# Patient Record
Sex: Male | Born: 1965 | Race: White | Hispanic: No | Marital: Single | Smoking: Current every day smoker
Health system: Southern US, Community
[De-identification: ages and names within clinical notes are randomized; demographics above are authoritative.]

## PROBLEM LIST (undated history)

## (undated) DIAGNOSIS — G2581 Restless legs syndrome: Secondary | ICD-10-CM

## (undated) DIAGNOSIS — M1990 Unspecified osteoarthritis, unspecified site: Secondary | ICD-10-CM

## (undated) DIAGNOSIS — Z5189 Encounter for other specified aftercare: Secondary | ICD-10-CM

## (undated) DIAGNOSIS — Z720 Tobacco use: Secondary | ICD-10-CM

## (undated) DIAGNOSIS — N289 Disorder of kidney and ureter, unspecified: Secondary | ICD-10-CM

## (undated) HISTORY — DX: Tobacco use: Z72.0

## (undated) HISTORY — PX: JOINT REPLACEMENT: SHX530

## (undated) HISTORY — DX: Restless legs syndrome: G25.81

## (undated) HISTORY — DX: Unspecified osteoarthritis, unspecified site: M19.90

## (undated) HISTORY — PX: CHOLECYSTECTOMY: SHX55

## (undated) HISTORY — DX: Encounter for other specified aftercare: Z51.89

---
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
## 2005-06-05 ENCOUNTER — Ambulatory Visit: Payer: Self-pay | Admitting: Internal Medicine

## 2005-06-12 ENCOUNTER — Ambulatory Visit: Payer: Self-pay | Admitting: Internal Medicine

## 2005-08-21 ENCOUNTER — Ambulatory Visit: Payer: Self-pay | Admitting: Internal Medicine

## 2005-10-19 ENCOUNTER — Ambulatory Visit: Payer: Self-pay | Admitting: Family Medicine

## 2006-02-01 ENCOUNTER — Ambulatory Visit: Payer: Self-pay | Admitting: Family Medicine

## 2006-02-01 LAB — CONVERTED CEMR LAB
ALT: 24 units/L (ref 0–40)
Albumin: 3.7 g/dL (ref 3.5–5.2)
Alkaline Phosphatase: 57 units/L (ref 39–117)
BUN: 12 mg/dL (ref 6–23)
Basophils Relative: 0.1 % (ref 0.0–1.0)
CO2: 30 meq/L (ref 19–32)
Eosinophil percent: 2 % (ref 0.0–5.0)
GFR calc non Af Amer: 87 mL/min
HCT: 46 % (ref 39.0–52.0)
Lymphocytes Relative: 34.8 % (ref 12.0–46.0)
MCV: 92 fL (ref 78.0–100.0)
Monocytes Absolute: 0.7 10*3/uL (ref 0.2–0.7)
Neutrophils Relative %: 53.6 % (ref 43.0–77.0)
Potassium: 5.2 meq/L — ABNORMAL HIGH (ref 3.5–5.1)
Total Bilirubin: 1.1 mg/dL (ref 0.3–1.2)
Total Protein: 7 g/dL (ref 6.0–8.3)
WBC: 7.5 10*3/uL (ref 4.5–10.5)

## 2006-02-04 ENCOUNTER — Ambulatory Visit: Payer: Self-pay | Admitting: Gastroenterology

## 2006-02-04 ENCOUNTER — Ambulatory Visit: Payer: Self-pay | Admitting: Family Medicine

## 2006-03-25 ENCOUNTER — Ambulatory Visit: Payer: Self-pay | Admitting: Family Medicine

## 2006-03-26 ENCOUNTER — Ambulatory Visit: Payer: Self-pay | Admitting: Cardiology

## 2006-04-29 ENCOUNTER — Ambulatory Visit: Payer: Self-pay | Admitting: Internal Medicine

## 2006-04-29 LAB — CONVERTED CEMR LAB
Ferritin: 26.9 ng/mL (ref 22.0–322.0)
Iron: 50 ug/dL (ref 42–165)
Magnesium: 2.1 mg/dL (ref 1.5–2.5)

## 2006-08-20 ENCOUNTER — Ambulatory Visit: Payer: Self-pay | Admitting: Internal Medicine

## 2006-09-23 ENCOUNTER — Ambulatory Visit: Payer: Self-pay | Admitting: Internal Medicine

## 2006-10-18 ENCOUNTER — Ambulatory Visit: Payer: Self-pay | Admitting: Internal Medicine

## 2006-10-18 ENCOUNTER — Encounter: Payer: Self-pay | Admitting: Internal Medicine

## 2006-10-21 ENCOUNTER — Encounter: Payer: Self-pay | Admitting: Internal Medicine

## 2006-10-21 DIAGNOSIS — G2581 Restless legs syndrome: Secondary | ICD-10-CM | POA: Insufficient documentation

## 2006-10-21 DIAGNOSIS — K219 Gastro-esophageal reflux disease without esophagitis: Secondary | ICD-10-CM | POA: Insufficient documentation

## 2007-02-20 ENCOUNTER — Telehealth (INDEPENDENT_AMBULATORY_CARE_PROVIDER_SITE_OTHER): Payer: Self-pay | Admitting: *Deleted

## 2007-02-21 ENCOUNTER — Telehealth: Payer: Self-pay | Admitting: Internal Medicine

## 2007-03-03 ENCOUNTER — Encounter: Payer: Self-pay | Admitting: Internal Medicine

## 2007-05-02 ENCOUNTER — Ambulatory Visit: Payer: Self-pay | Admitting: Internal Medicine

## 2007-05-02 DIAGNOSIS — F319 Bipolar disorder, unspecified: Secondary | ICD-10-CM | POA: Insufficient documentation

## 2007-05-02 LAB — CONVERTED CEMR LAB
Basophils Relative: 0.4 % (ref 0.0–1.0)
CO2: 32 meq/L (ref 19–32)
Creatinine, Ser: 0.9 mg/dL (ref 0.4–1.5)
HCT: 44.7 % (ref 39.0–52.0)
Hemoglobin: 15 g/dL (ref 13.0–17.0)
MCHC: 33.5 g/dL (ref 30.0–36.0)
Monocytes Absolute: 0.8 10*3/uL — ABNORMAL HIGH (ref 0.2–0.7)
Neutrophils Relative %: 54 % (ref 43.0–77.0)
Potassium: 4.8 meq/L (ref 3.5–5.1)
RDW: 11.8 % (ref 11.5–14.6)
Sodium: 142 meq/L (ref 135–145)
TSH: 1.02 microintl units/mL (ref 0.35–5.50)
Total Bilirubin: 0.8 mg/dL (ref 0.3–1.2)
Total Protein: 6.6 g/dL (ref 6.0–8.3)
Valproic Acid Lvl: 42.3 ug/mL — ABNORMAL LOW (ref 50.0–100.0)

## 2007-05-13 ENCOUNTER — Telehealth: Payer: Self-pay | Admitting: Internal Medicine

## 2007-05-13 ENCOUNTER — Encounter: Payer: Self-pay | Admitting: Internal Medicine

## 2007-08-21 ENCOUNTER — Telehealth: Payer: Self-pay | Admitting: Internal Medicine

## 2008-05-25 ENCOUNTER — Ambulatory Visit: Payer: Self-pay | Admitting: Internal Medicine

## 2008-05-25 DIAGNOSIS — I10 Essential (primary) hypertension: Secondary | ICD-10-CM | POA: Insufficient documentation

## 2008-06-01 ENCOUNTER — Ambulatory Visit: Payer: Self-pay | Admitting: Internal Medicine

## 2008-06-01 DIAGNOSIS — J069 Acute upper respiratory infection, unspecified: Secondary | ICD-10-CM | POA: Insufficient documentation

## 2009-03-29 ENCOUNTER — Ambulatory Visit: Payer: Self-pay | Admitting: Internal Medicine

## 2009-10-11 ENCOUNTER — Telehealth: Payer: Self-pay | Admitting: Internal Medicine

## 2009-10-28 ENCOUNTER — Ambulatory Visit: Payer: Self-pay | Admitting: Internal Medicine

## 2009-10-28 DIAGNOSIS — Z87898 Personal history of other specified conditions: Secondary | ICD-10-CM | POA: Insufficient documentation

## 2009-11-24 ENCOUNTER — Ambulatory Visit: Payer: Self-pay | Admitting: Family Medicine

## 2009-11-24 ENCOUNTER — Encounter: Payer: Self-pay | Admitting: Family Medicine

## 2009-11-24 DIAGNOSIS — E860 Dehydration: Secondary | ICD-10-CM | POA: Insufficient documentation

## 2009-11-24 DIAGNOSIS — K5289 Other specified noninfective gastroenteritis and colitis: Secondary | ICD-10-CM | POA: Insufficient documentation

## 2009-11-24 DIAGNOSIS — R1013 Epigastric pain: Secondary | ICD-10-CM | POA: Insufficient documentation

## 2009-11-24 DIAGNOSIS — F17200 Nicotine dependence, unspecified, uncomplicated: Secondary | ICD-10-CM | POA: Insufficient documentation

## 2009-11-25 LAB — CONVERTED CEMR LAB
AST: 14 units/L (ref 0–37)
BUN: 12 mg/dL (ref 6–23)
Basophils Absolute: 0 10*3/uL (ref 0.0–0.1)
Creatinine, Ser: 1 mg/dL (ref 0.4–1.5)
Eosinophils Absolute: 0.1 10*3/uL (ref 0.0–0.7)
GFR calc non Af Amer: 88.44 mL/min (ref >60)
H Pylori IgG: NEGATIVE
HCT: 46 % (ref 39.0–52.0)
Lymphs Abs: 2.1 10*3/uL (ref 0.7–4.0)
Monocytes Relative: 6.5 % (ref 3.0–12.0)
Phosphorus: 3.3 mg/dL (ref 2.3–4.6)
Platelets: 202 10*3/uL (ref 150.0–400.0)
RDW: 13.8 % (ref 11.5–14.6)
Total Bilirubin: 0.5 mg/dL (ref 0.3–1.2)

## 2010-03-21 ENCOUNTER — Ambulatory Visit: Payer: Self-pay | Admitting: Internal Medicine

## 2010-05-09 NOTE — Letter (Signed)
Summary: Out of Work  [facility] [employer] [facility] [employer]  [address]   [place], [place] [address]   Phone: [phone]  Fax: [phone]    November 24, 2009   Employee:  Patrick [name] [facility]    To Whom It May Concern:   For Medical reasons, please excuse the above named employee from work for the following dates:  Start:   11/24/2009  End; 11/25/2009  If you need additional information, please feel free to contact our office.         Sincerely,    Danise Edge MD
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
## 2010-05-09 NOTE — Progress Notes (Signed)
Summary: Pt is wanted to have a colonoscopy done   Phone Note Call from Patient Call back at [phone] Debbie home   Caller: spouse- Debbie Summary of Call: Pt would like to sch a colonoscopy because pt says that he is having some problems. Pt didn't was to sch appt to be evaluated, because of his work schedule.  Pt wants to get this colonoscopy done on 10/28/09 if at all possible.  Please advise.   Initial call taken by: Lucy Antigua,  October 11, 2009 12:49 PM  Follow-up for Phone Call        spoke with wife - could not tell ne what signs he was having, I explained that may not have to come see [place] if insurance doesnt req. ref. from [place] - but then they would have to have appt to setr colo at [facility] office , do ppwk and then the colo would be shceduled. Wife did not seem very happy that they just couldnt schedule colo and it would be done, I told her I would forward to Dr. Amador Cunas to inform and he would advise. She said , they just need to go to [facility]. KIK  They are aware Dr. Amador Cunas is out of office until next monday 7/11 Follow-up by: Duard Brady LPN,  October 12, 979 1:33 PM  Additional Follow-up for Phone Call Additional follow up Details #1::        OK to schedule but notify patient that Insurance may not pay for procedure since he is  not 45 years old and we have no info to recommend a diagnostic colonoscopy Additional Follow-up by: Gordy Savers  MD,  October 16, 2009 8:10 PM    Additional Follow-up for Phone Call Additional follow up Details #2::    attempt to call Debbie - ans mach - LMTCB if they want to schedule - but inurance may not cover since we dont have any dx to refer with and he is under 50. Call to let us know if they need the referal. KIK

## 2010-05-09 NOTE — Assessment & Plan Note (Signed)
Summary: ST/?FEVER/DIARRHEA/NJR   Vital Signs:  Patient profile:   45 year old male Height:      68 inches (172.72 cm) Weight:      295 pounds (134.09 kg) O2 Sat:      98 % on Room air Temp:     98.5 degrees F (36.94 degrees C) oral Pulse rate:   96 / minute BP sitting:   138 / 92  (left arm) Cuff size:   large  Vitals Entered By: Josph Macho RMA (November 24, 2009 9:27 AM)  O2 Flow:  Room air CC: Fever/ Diarrhea/ CF Is Patient Diabetic? No   History of Present Illness: Patient in today for evaluation of diarrhea which started acutely yesterday. Late Tuesday night he was noting some malaise and myalgias and then by yesterday morning nausea/chills and diarrhea set in. He has had 6 loose stool since this began, some abdominal cramping nad low grade fevers and chills. His appetite is poor and he has been drinking a lot of water but not eating much. 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 He denies any focal abdominal pain but instead has intermittent diffuse cramping with the diarrhea. No bloody or tarry stool. No CP/palp/SOB/GU c/o. No dysuria/urinary frequency/urgency/hesitancy.  Current Medications (verified): 1)  Requip 3 Mg  Tabs (Ropinirole Hcl) .Marland Kitchen.. 1 At Bedtime 2)  Carbidopa-Levodopa 25-100 Mg  Tabs (Carbidopa-Levodopa) .... As Needed 3)  Naprosyn 500 Mg Tabs (Naproxen) .... Once or Twice A Day As Needed  Allergies (verified): No Known Drug Allergies  Past History:  Past medical history reviewed for relevance to current acute and chronic problems. Social history (including risk factors) reviewed for relevance to current acute and chronic problems.  Past Medical History: Reviewed history from 05/25/2008 and no changes required. GERD restless legs obesity bipolar depression Hypertension  Social History: Reviewed history and no changes required.  Review of Systems      See HPI  Physical Exam  General:  Flushedalert,  well-developed, well-nourished, and uncomfortable-appearing.  Obese Head:  Normocephalic and atraumatic without obvious abnormalities. No apparent alopecia or balding. Nose:  External nasal examination shows no deformity or inflammation. Nasal mucosa are pink and moist without lesions or exudates. Mouth:  Dry mucus membranes Neck:  No deformities, masses, or tenderness noted. Lungs:  Normal respiratory effort, chest expands symmetrically. Lungs are clear to auscultation, no crackles or wheezes. Heart:  Normal rate and regular rhythm. S1 and S2 normal without gallop, murmur, click, rub or other extra sounds. Abdomen:  soft, normal bowel sounds, no guarding, no rigidity, no rebound tenderness, no hepatomegaly, no splenomegaly, epigastric tenderness, and LUQ tenderness, mild with palpation Extremities:    Psych:  Cognition and judgment appear intact. Alert and cooperative with normal attention span and concentration. No apparent delusions, illusions, hallucinations   Impression & Recommendations:  Problem # 1:  GASTROENTERITIS, ACUTE (ICD-558.9)  His updated medication list for this problem includes:    Zofran Odt 4 Mg Tbdp (Ondansetron) .Marland Kitchen... 1 tab by mouth q 6 hours as needed n/v  Orders: TLB-Renal Function Panel (80069-RENAL) TLB-CBC Platelet - w/Differential (85025-CBCD) TLB-Hepatic/Liver Function Pnl (80076-HEPATIC) Increase fluids, using some electrolyte containing fluids, avoid fatty and spicy foods, check an abd xray and H Pylori due to LUQ pain on palp Report worsening symptoms  Problem # 2:  DEHYDRATION (ICD-276.51) Increase clear fluids, avoid caffeine  Problem # 3:  HYPERTENSION (ICD-401.9) mild elevation with acute illness, would not add medications  today  Problem # 4:  TOBACCO USER (ICD-305.1)  reports roughly a 1/2 ppd especially when driving, previously smoked a ppd. Encouraged to consider cessation and counselled on various techniques for cutting down andquitting,  consider nicotine replacement products, counselled for > 3 minutes.  Orders: Tobacco use cessation intermediate 3-10 minutes ([address])  Complete Medication List: 1)  Requip 3 Mg Tabs (Ropinirole hcl) .Marland Kitchen.. 1 at bedtime 2)  Carbidopa-levodopa 25-100 Mg Tabs (Carbidopa-levodopa) .... As needed 3)  Naprosyn 500 Mg Tabs (Naproxen) .... Once or twice a day as needed 4)  Zofran Odt 4 Mg Tbdp (Ondansetron) .Marland Kitchen.. 1 tab by mouth q 6 hours as needed n/v  Other Orders: TLB-Sedimentation Rate (ESR) (85652-ESR) TLB-H. Pylori Abs(Helicobacter Pylori) (86677-HELICO) T-Abdomen 2-view (74020TC) Venipuncture ([address]) Specimen Handling ([address])  Patient Instructions: 1)  Oral rehydration solution: Drink 1/2 ounce every 15 minutes. If tolerated after 1 hour, drink 1 ounce every 15 minutes. As you  can tolerate, keep adding 1/2 ounce every 15 minutes, up to a total or 2-4 ounces. Contact the office if unable to tolerate oral solution, if you keep vomiting, or you continue to have signs of dehydration. 2)  The main problem with gastroentereritis is dehydration. Drink plenty of fluids and take solids as you feel better. If you are unable to keep anything down and/or you show signs of dehydration( dry cracked lips, lack of tears, not urinating, very sleepy) , call our office.  3)  clear fluids for next 24 hours and then add BRAT diet as tolerated (bananas, rice, applesauce, toast) 4)  Report worsening symptoms Prescriptions: ZOFRAN ODT 4 MG TBDP (ONDANSETRON) 1 tab by mouth q 6 hours as needed n/v  #20 x 0   Entered and Authorized by:   Danise Edge MD   Signed by:   Danise Edge MD on 11/24/2009   Method used:   Print then Give to Patient   RxID:   [id number]
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
## 2010-08-25 NOTE — Assessment & Plan Note (Signed)
[facility]                                   ON-CALL NOTE   FRANCOIS, ELK                         MRN:          [id number]  DATE:10/19/2005                            DOB:          [birth year]    Patient of Dr. Amador Cunas   Call from [phone] at 6:00 a.m. on October 19, 2005 stating he felt like he had  a hemorrhoid that was tender to touch, but no blood and wanted to know what  he should do.  I recommended he call the office first thing this morning at  8:30 and get an appointment to see Dr. Amador Cunas or whichever doctor would  be able to see him in the office today so that he could be evaluated.                                   Lelon Perla, DO   YRL/MedQ  DD:  10/19/2005  DT:  10/19/2005  Job #:  [id number]   cc:   Gordy Savers, MD
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
## 2010-08-25 NOTE — Assessment & Plan Note (Signed)
[facility]                                 ON-CALL NOTE   [name], Patrick                         MRN:          [id number]  DATE:10/29/2006                            DOB:          [birth year]    Misty Stanley from [facility] called me at [phone] at 9:05 p.m. on October 29, 2006.  Stating that they would like to change his Requip to generic to make it  cheaper for the patient.  Permission was given to change Requip to  generic.     Lelon Perla, DO  Electronically Signed    Shawnie Dapper  DD: 10/29/2006  DT: 10/30/2006  Job #: [id number]   cc:   Gordy Savers, MD

## 2010-08-29 ENCOUNTER — Telehealth: Payer: Self-pay | Admitting: Internal Medicine

## 2010-08-29 ENCOUNTER — Ambulatory Visit (INDEPENDENT_AMBULATORY_CARE_PROVIDER_SITE_OTHER): Payer: Federal, State, Local not specified - PPO | Admitting: Internal Medicine

## 2010-08-29 ENCOUNTER — Encounter: Payer: Self-pay | Admitting: Internal Medicine

## 2010-08-29 DIAGNOSIS — F17200 Nicotine dependence, unspecified, uncomplicated: Secondary | ICD-10-CM

## 2010-08-29 DIAGNOSIS — R079 Chest pain, unspecified: Secondary | ICD-10-CM | POA: Insufficient documentation

## 2010-08-29 DIAGNOSIS — I10 Essential (primary) hypertension: Secondary | ICD-10-CM

## 2010-08-29 DIAGNOSIS — E669 Obesity, unspecified: Secondary | ICD-10-CM

## 2010-08-29 MED ORDER — METOPROLOL TARTRATE 50 MG PO TABS: 50.0000 mg | ORAL_TABLET | Freq: Two times a day (BID) | ORAL | Status: DC

## 2010-08-29 NOTE — Telephone Encounter (Signed)
Pt needs work note from today  ?stress test is sch for June 2012. Please advise
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
## 2010-08-29 NOTE — Patient Instructions (Addendum)
One aspirin daily  Stress test as discussed  Call immediately if you develop any further chest pain  Smoking tobacco is very bad for your health. You should stop smoking immediately.

## 2010-08-30 NOTE — Telephone Encounter (Signed)
Letter done and faxed - pt aware. KIK

## 2010-08-30 NOTE — Telephone Encounter (Signed)
Pt called to check on status of work note. Pt needs to note to be faxed to pt work fax # [phone] attn Orion [employer] and [employer]. This needs to be done today. Stress test has been moved to 09/05/10.

## 2010-09-05 ENCOUNTER — Ambulatory Visit (HOSPITAL_COMMUNITY): Payer: Federal, State, Local not specified - PPO | Attending: Internal Medicine | Admitting: Radiology

## 2010-09-05 ENCOUNTER — Encounter (HOSPITAL_COMMUNITY): Payer: Federal, State, Local not specified - PPO | Admitting: Radiology

## 2010-09-05 ENCOUNTER — Telehealth: Payer: Self-pay | Admitting: Internal Medicine

## 2010-09-05 DIAGNOSIS — R079 Chest pain, unspecified: Secondary | ICD-10-CM

## 2010-09-05 DIAGNOSIS — R579 Shock, unspecified: Secondary | ICD-10-CM

## 2010-09-05 DIAGNOSIS — I4949 Other premature depolarization: Secondary | ICD-10-CM

## 2010-09-05 DIAGNOSIS — R0989 Other specified symptoms and signs involving the circulatory and respiratory systems: Secondary | ICD-10-CM

## 2010-09-05 DIAGNOSIS — R0789 Other chest pain: Secondary | ICD-10-CM

## 2010-09-05 MED ORDER — TECHNETIUM TC 99M TETROFOSMIN IV KIT
33.0000 | PACK | Freq: Once | INTRAVENOUS | Status: AC | PRN
  Administered 2010-09-05: 33 via INTRAVENOUS

## 2010-09-05 NOTE — Telephone Encounter (Signed)
Called pt about letter for work - he asked that i fax to work # given before. KIK

## 2010-09-05 NOTE — Telephone Encounter (Signed)
ok 

## 2010-09-05 NOTE — Telephone Encounter (Signed)
Please advise 
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
## 2010-09-05 NOTE — Telephone Encounter (Signed)
Pt just had first part of stress test today. Will not get results back until Thurs. Pt is suppose to return to work thurs and is wondering if he can get Drs note to be out the rest of the week? Pls notify pt when drs note is avail and ready for pick up.

## 2010-09-06 ENCOUNTER — Ambulatory Visit (HOSPITAL_COMMUNITY): Payer: Federal, State, Local not specified - PPO | Attending: Internal Medicine | Admitting: Radiology

## 2010-09-06 DIAGNOSIS — R0989 Other specified symptoms and signs involving the circulatory and respiratory systems: Secondary | ICD-10-CM

## 2010-09-06 MED ORDER — TECHNETIUM TC 99M TETROFOSMIN IV KIT
33.0000 | PACK | Freq: Once | INTRAVENOUS | Status: AC | PRN
  Administered 2010-09-06: 33 via INTRAVENOUS

## 2010-09-07 NOTE — Progress Notes (Signed)
COPY ROUTED TO DR.[email or web address].Scarlette Ar

## 2010-09-08 ENCOUNTER — Encounter: Payer: Self-pay | Admitting: Internal Medicine

## 2010-09-08 ENCOUNTER — Ambulatory Visit (INDEPENDENT_AMBULATORY_CARE_PROVIDER_SITE_OTHER): Payer: Federal, State, Local not specified - PPO | Admitting: Internal Medicine

## 2010-09-08 DIAGNOSIS — I10 Essential (primary) hypertension: Secondary | ICD-10-CM

## 2010-09-08 DIAGNOSIS — R079 Chest pain, unspecified: Secondary | ICD-10-CM

## 2010-09-08 DIAGNOSIS — F17200 Nicotine dependence, unspecified, uncomplicated: Secondary | ICD-10-CM

## 2010-09-08 DIAGNOSIS — G2581 Restless legs syndrome: Secondary | ICD-10-CM

## 2010-09-08 MED ORDER — CLONAZEPAM 0.5 MG PO TABS: 0.5000 mg | ORAL_TABLET | Freq: Two times a day (BID) | ORAL | Status: DC | PRN

## 2010-09-08 NOTE — Progress Notes (Signed)
  Subjective:    Patient ID: Patrick [name], male    DOB: 06/11/1965, 45 y.o.   MRN: [id number]  HPI  45 year old patient who is in today for followup. He was evaluated recently for chest pain. He's had a recent Cardiolite stress test that was low risk. Since the chest pain ED evaluation he has been tobacco free. He is in no significant chest pain. Pending results of the Cardiolite he has been on low-dose metoprolol. He feels well today except for severely symptomatic restless legs symptoms.    Review of Systems  Constitutional: Negative for fever, chills, appetite change and fatigue.  HENT: Negative for hearing loss, ear pain, congestion, sore throat, trouble swallowing, neck stiffness, dental problem, voice change and tinnitus.   Eyes: Negative for pain, discharge and visual disturbance.  Respiratory: Negative for cough, chest tightness, wheezing and stridor.   Cardiovascular: Negative for chest pain, palpitations and leg swelling.  Gastrointestinal: Negative for nausea, vomiting, abdominal pain, diarrhea, constipation, blood in stool and abdominal distention.  Genitourinary: Negative for urgency, hematuria, flank pain, discharge, difficulty urinating and genital sores.  Musculoskeletal: Negative for myalgias, back pain, joint swelling, arthralgias and gait problem.  Skin: Negative for rash.  Neurological: Negative for dizziness, syncope, speech difficulty, weakness, numbness and headaches.  Hematological: Negative for adenopathy. Does not bruise/bleed easily.  Psychiatric/Behavioral: Negative for behavioral problems and dysphoric mood. The patient is not nervous/anxious.        Objective:   Physical Exam  Constitutional: He appears well-developed and well-nourished. No distress.       Blood pressure low normal          Assessment & Plan:   Chest pain syndrome. Low likelihood for coronary artery disease. Permanent smoking cessation encouraged will call if he develops any worsening  symptoms. Results of the stress test discussed we'll taper and discontinue metoprolol Restless leg syndrome. We'll add clonazepam at bedtime. Consider neurology referral if unimproved

## 2010-09-08 NOTE — Patient Instructions (Signed)
Taper and discontinue metoprolol    It is important that you exercise regularly, at least 20 minutes 3 to 4 times per week.  If you develop chest pain or shortness of breath seek  medical attention.  Return in 3 months for follow-up

## 2010-09-13 ENCOUNTER — Encounter (HOSPITAL_COMMUNITY): Payer: Federal, State, Local not specified - PPO | Admitting: Radiology

## 2010-12-30 ENCOUNTER — Other Ambulatory Visit: Payer: Self-pay | Admitting: Internal Medicine

## 2011-06-23 ENCOUNTER — Other Ambulatory Visit: Payer: Self-pay | Admitting: Internal Medicine

## 2011-06-26 ENCOUNTER — Other Ambulatory Visit: Payer: Federal, State, Local not specified - PPO

## 2011-07-03 ENCOUNTER — Encounter: Payer: Federal, State, Local not specified - PPO | Admitting: Internal Medicine

## 2012-02-12 ENCOUNTER — Other Ambulatory Visit: Payer: Federal, State, Local not specified - PPO

## 2012-02-19 ENCOUNTER — Encounter: Payer: Federal, State, Local not specified - PPO | Admitting: Internal Medicine

## 2012-03-11 ENCOUNTER — Other Ambulatory Visit: Payer: Self-pay | Admitting: Internal Medicine

## 2012-03-21 ENCOUNTER — Ambulatory Visit (INDEPENDENT_AMBULATORY_CARE_PROVIDER_SITE_OTHER): Payer: Federal, State, Local not specified - PPO | Admitting: Internal Medicine

## 2012-03-21 ENCOUNTER — Encounter: Payer: Self-pay | Admitting: Internal Medicine

## 2012-03-21 VITALS — BP 140/90 | HR 94 | Temp 97.8°F | Resp 20 | Wt 306.0 lb

## 2012-03-21 DIAGNOSIS — G2581 Restless legs syndrome: Secondary | ICD-10-CM

## 2012-03-21 DIAGNOSIS — I10 Essential (primary) hypertension: Secondary | ICD-10-CM

## 2012-03-21 DIAGNOSIS — E669 Obesity, unspecified: Secondary | ICD-10-CM

## 2012-03-21 DIAGNOSIS — R1013 Epigastric pain: Secondary | ICD-10-CM

## 2012-03-21 MED ORDER — ROPINIROLE HCL 4 MG PO TABS: 4.0000 mg | ORAL_TABLET | Freq: Every day | ORAL | Status: DC

## 2012-03-21 MED ORDER — CARBIDOPA-LEVODOPA 25-100 MG PO TABS: 1.0000 | ORAL_TABLET | Freq: Every day | ORAL | Status: DC

## 2012-03-21 MED ORDER — METOPROLOL TARTRATE 50 MG PO TABS: 50.0000 mg | ORAL_TABLET | Freq: Two times a day (BID) | ORAL | Status: DC

## 2012-03-21 NOTE — Progress Notes (Signed)
  Subjective:    Patient ID: Patrick [name], male    DOB: 05/02/1965, 46 y.o.   MRN: [id number]  HPI  46 year old seen today for followup of hypertension restless leg syndrome. He has a history of exogenous obesity  Wt Readings from Last 3 Encounters:  03/21/12 306 lb (138.801 kg)  09/08/10 309 lb (140.161 kg)  09/05/10 304 lb (137.893 kg)    Review of Systems  Constitutional: Negative for fever, chills, appetite change and fatigue.  HENT: Negative for hearing loss, ear pain, congestion, sore throat, trouble swallowing, neck stiffness, dental problem, voice change and tinnitus.   Eyes: Negative for pain, discharge and visual disturbance.  Respiratory: Negative for cough, chest tightness, wheezing and stridor.   Cardiovascular: Negative for chest pain, palpitations and leg swelling.  Gastrointestinal: Negative for nausea, vomiting, abdominal pain, diarrhea, constipation, blood in stool and abdominal distention.  Genitourinary: Negative for urgency, hematuria, flank pain, discharge, difficulty urinating and genital sores.  Musculoskeletal: Negative for myalgias, back pain, joint swelling, arthralgias and gait problem.  Skin: Negative for rash.  Neurological: Negative for dizziness, syncope, speech difficulty, weakness, numbness and headaches.  Hematological: Negative for adenopathy. Does not bruise/bleed easily.  Psychiatric/Behavioral: Negative for behavioral problems and dysphoric mood. The patient is not nervous/anxious.        Objective:   Physical Exam  Constitutional: He is oriented to person, place, and time. He appears well-developed.       Obese  HENT:  Head: Normocephalic.  Right Ear: External ear normal.  Left Ear: External ear normal.  Eyes: Conjunctivae normal and EOM are normal.  Neck: Normal range of motion.  Cardiovascular: Normal rate and normal heart sounds.   Pulmonary/Chest: Breath sounds normal.  Abdominal: Bowel sounds are normal.  Musculoskeletal: Normal  range of motion. He exhibits no edema and no tenderness.  Neurological: He is alert and oriented to person, place, and time.  Psychiatric: He has a normal mood and affect. His behavior is normal.          Assessment & Plan:

## 2012-03-21 NOTE — Progress Notes (Signed)
Subjective:    Patient ID: Patrick [name], male    DOB: 08-31-1965, 46 y.o.   MRN: [id number]  HPI  46 year old patient who is seen today for followup. He has a history of obesity and restless leg syndrome. He has a history of hypertension but has not been on medications recently. His restless leg syndrome is reasonable control on present regimen. No new concerns or complaints  No past medical history on file.  History   Social History  . Marital Status: Single    Spouse Name: N/A    Number of Children: N/A  . Years of Education: N/A   Occupational History  . Not on file.   Social History Main Topics  . Smoking status: Former Smoker    Quit date: 08/29/2010  . Smokeless tobacco: Never Used  . Alcohol Use: Yes     Comment: ocassionally  . Drug Use: No  . Sexually Active: Not on file   Other Topics Concern  . Not on file   Social History Narrative  . No narrative on file    No past surgical history on file.  No family history on file.  No Known Allergies  Current Outpatient Prescriptions on File Prior to Visit  Medication Sig Dispense Refill  . carbidopa-levodopa (SINEMET) 25-100 MG per tablet TAKE AS DIRECTED AS NEEDED  90 tablet  1  . rOPINIRole (REQUIP) 4 MG tablet Take 4 mg by mouth at bedtime.        Marland Kitchen aspirin 81 MG tablet Take 81 mg by mouth daily.        . metoprolol (LOPRESSOR) 50 MG tablet Take 1 tablet (50 mg total) by mouth 2 (two) times daily.  90 tablet  0  . naproxen (NAPROSYN) 500 MG tablet TAKE 1 TABLET ONCE OR TWICE A DAY AS NEEDED  180 tablet  0    BP 140/90  Pulse 94  Temp 97.8 F (36.6 C) (Oral)  Resp 20  Wt 306 lb (138.801 kg)  SpO2 97%       Review of Systems  Constitutional: Negative for fever, chills, appetite change and fatigue.  HENT: Negative for hearing loss, ear pain, congestion, sore throat, trouble swallowing, neck stiffness, dental problem, voice change and tinnitus.   Eyes: Negative for pain, discharge and visual  disturbance.  Respiratory: Negative for cough, chest tightness, wheezing and stridor.   Cardiovascular: Negative for chest pain, palpitations and leg swelling.  Gastrointestinal: Negative for nausea, vomiting, abdominal pain, diarrhea, constipation, blood in stool and abdominal distention.  Genitourinary: Negative for urgency, hematuria, flank pain, discharge, difficulty urinating and genital sores.  Musculoskeletal: Negative for myalgias, back pain, joint swelling, arthralgias and gait problem.  Skin: Negative for rash.  Neurological: Negative for dizziness, syncope, speech difficulty, weakness, numbness and headaches.  Hematological: Negative for adenopathy. Does not bruise/bleed easily.  Psychiatric/Behavioral: Negative for behavioral problems and dysphoric mood. The patient is not nervous/anxious.        Objective:   Physical Exam  Constitutional: He is oriented to person, place, and time. He appears well-developed.  HENT:  Head: Normocephalic.  Right Ear: External ear normal.  Left Ear: External ear normal.  Eyes: Conjunctivae normal and EOM are normal.  Neck: Normal range of motion.  Cardiovascular: Normal rate and normal heart sounds.   Pulmonary/Chest: Breath sounds normal.  Abdominal: Bowel sounds are normal.  Musculoskeletal: Normal range of motion. He exhibits no edema and no tenderness.  Neurological: He is alert and oriented to person, place,  and time.  Psychiatric: He has a normal mood and affect. His behavior is normal.          Assessment & Plan:   Restless leg syndrome. We'll continue present regimen Exogenous obesity. Weight loss encouraged Hypertension. We'll resume metoprolol  Return at his convenience for a annual exam

## 2012-03-21 NOTE — Patient Instructions (Addendum)
Limit your sodium (Salt) intake  Please check your blood pressure on a regular basis.  If it is consistently greater than 150/90, please make an office appointment.    It is important that you exercise regularly, at least 20 minutes 3 to 4 times per week.  If you develop chest pain or shortness of breath seek  medical attention.  Return in 6 months for follow-up  

## 2012-03-25 ENCOUNTER — Encounter: Payer: Self-pay | Admitting: Internal Medicine

## 2012-05-05 ENCOUNTER — Encounter: Payer: Self-pay | Admitting: Internal Medicine

## 2012-05-05 ENCOUNTER — Ambulatory Visit (INDEPENDENT_AMBULATORY_CARE_PROVIDER_SITE_OTHER): Payer: Federal, State, Local not specified - PPO | Admitting: Internal Medicine

## 2012-05-05 VITALS — BP 124/80 | Temp 98.6°F | Wt 312.0 lb

## 2012-05-05 DIAGNOSIS — F17200 Nicotine dependence, unspecified, uncomplicated: Secondary | ICD-10-CM

## 2012-05-05 DIAGNOSIS — R509 Fever, unspecified: Secondary | ICD-10-CM

## 2012-05-05 DIAGNOSIS — J111 Influenza due to unidentified influenza virus with other respiratory manifestations: Secondary | ICD-10-CM | POA: Insufficient documentation

## 2012-05-05 LAB — POCT INFLUENZA A/B: Influenza B, POC: NEGATIVE

## 2012-05-05 MED ORDER — OSELTAMIVIR PHOSPHATE 75 MG PO CAPS: 75.0000 mg | ORAL_CAPSULE | Freq: Two times a day (BID) | ORAL | Status: DC

## 2012-05-05 NOTE — Assessment & Plan Note (Signed)
Patient strongly encouraged to discontinue tobacco use

## 2012-05-05 NOTE — Progress Notes (Signed)
  Subjective:    Patient ID: Patrick [name], male    DOB: 05/08/1965, 47 y.o.   MRN: [id number]  HPI  47 year old white male with history of obesity, restless leg syndrome and tobacco user complains of flulike symptoms her last 24-48 hours. Patient complains of fever as high as 101.6 last night. Patient is feeling achy over. He denies any significant cough or shortness of breath.  Patient works as a IT trainer. He usually smokes less than 1 pack per day.  Review of Systems See HPI  No past medical history on file.  History   Social History  . Marital Status: Single    Spouse Name: N/A    Number of Children: N/A  . Years of Education: N/A   Occupational History  . Not on file.   Social History Main Topics  . Smoking status: Former Smoker    Quit date: 08/29/2010  . Smokeless tobacco: Never Used  . Alcohol Use: Yes     Comment: ocassionally  . Drug Use: No  . Sexually Active: Not on file   Other Topics Concern  . Not on file   Social History Narrative  . No narrative on file    No past surgical history on file.  No family history on file.  No Known Allergies  Current Outpatient Prescriptions on File Prior to Visit  Medication Sig Dispense Refill  . aspirin 81 MG tablet Take 81 mg by mouth daily.        . carbidopa-levodopa (SINEMET IR) 25-100 MG per tablet Take 1 tablet by mouth at bedtime.  90 tablet  6  . naproxen (NAPROSYN) 500 MG tablet TAKE 1 TABLET ONCE OR TWICE A DAY AS NEEDED  180 tablet  0  . rOPINIRole (REQUIP) 4 MG tablet Take 1 tablet (4 mg total) by mouth at bedtime.  90 tablet  4    BP 124/80  Temp 98.6 F (37 C) (Oral)  Wt 312 lb (141.522 kg)       Objective:   Physical Exam  Constitutional: He is oriented to person, place, and time.       Pleasant, obese 47 y/o male   HENT:  Head: Normocephalic and atraumatic.  Right Ear: External ear normal.  Left Ear: External ear normal.       Oropharyngeal erythema  Neck: Neck supple.    Cardiovascular: Normal rate and normal heart sounds.   No murmur heard. Pulmonary/Chest: Effort normal and breath sounds normal. He has no wheezes.  Lymphadenopathy:    He has no cervical adenopathy.  Neurological: He is alert and oriented to person, place, and time. No cranial nerve deficit.  Psychiatric: He has a normal mood and affect. His behavior is normal.          Assessment & Plan:

## 2012-05-05 NOTE — Patient Instructions (Addendum)
Please call our office if your symptoms do not improve or gets worse. Increase fluid intake as directed You will need to be reevaluated if your symptoms do not resolve within 1-2 weeks

## 2012-05-05 NOTE — Assessment & Plan Note (Signed)
47 year old white male with signs and symptoms of possible influenza. Rapid influenza point-of-care test is negative. However, this may be false-negative.  Empiric treatment with Tamiflu 75 mg twice daily. Increase fluid intake. Use Tylenol 325 mg 1-2 tablets every 8 hours as needed. Patient understands he'll need to be reevaluated if worsening symptoms in 1-2 weeks.

## 2012-05-19 ENCOUNTER — Ambulatory Visit: Payer: Federal, State, Local not specified - PPO | Admitting: Internal Medicine

## 2012-05-23 ENCOUNTER — Ambulatory Visit: Payer: Federal, State, Local not specified - PPO | Admitting: Internal Medicine

## 2012-05-30 ENCOUNTER — Encounter: Payer: Self-pay | Admitting: Internal Medicine

## 2012-05-30 ENCOUNTER — Ambulatory Visit (INDEPENDENT_AMBULATORY_CARE_PROVIDER_SITE_OTHER): Payer: Federal, State, Local not specified - PPO | Admitting: Internal Medicine

## 2012-05-30 VITALS — BP 140/90 | HR 76 | Temp 98.0°F | Resp 20 | Wt 319.0 lb

## 2012-05-30 DIAGNOSIS — F17200 Nicotine dependence, unspecified, uncomplicated: Secondary | ICD-10-CM

## 2012-05-30 DIAGNOSIS — E669 Obesity, unspecified: Secondary | ICD-10-CM

## 2012-05-30 DIAGNOSIS — I10 Essential (primary) hypertension: Secondary | ICD-10-CM

## 2012-05-30 LAB — GLUCOSE, POCT (MANUAL RESULT ENTRY): POC Glucose: 115 mg/dl — AB (ref 70–99)

## 2012-05-30 NOTE — Progress Notes (Signed)
Subjective:    Patient ID: Patrick [name], male    DOB: Oct 04, 1965, 47 y.o.   MRN: [id number]  HPI  47 year old patient who is seen today for followup. He has a history of obesity. Recently he discontinued tobacco use and is on nicotine transdermal patches. He has joined a gym and has been exercising regularly. He has had some excessive thirst and also some intermittent numbness involving his right great toe and was concerned about diabetes.  History reviewed. No pertinent past medical history.  History   Social History  . Marital Status: Single    Spouse Name: N/A    Number of Children: N/A  . Years of Education: N/A   Occupational History  . Not on file.   Social History Main Topics  . Smoking status: Former Smoker    Types: Cigarettes    Quit date: 05/27/2012  . Smokeless tobacco: Never Used  . Alcohol Use: Yes     Comment: ocassionally  . Drug Use: No  . Sexually Active: Not on file   Other Topics Concern  . Not on file   Social History Narrative  . No narrative on file    History reviewed. No pertinent past surgical history.  No family history on file.  No Known Allergies  Current Outpatient Prescriptions on File Prior to Visit  Medication Sig Dispense Refill  . carbidopa-levodopa (SINEMET IR) 25-100 MG per tablet Take 1 tablet by mouth at bedtime.  90 tablet  6  . naproxen (NAPROSYN) 500 MG tablet TAKE 1 TABLET ONCE OR TWICE A DAY AS NEEDED  180 tablet  0  . rOPINIRole (REQUIP) 4 MG tablet Take 1 tablet (4 mg total) by mouth at bedtime.  90 tablet  4   No current facility-administered medications on file prior to visit.    BP 140/90  Pulse 76  Temp(Src) 98 F (36.7 C) (Oral)  Resp 20  Wt 319 lb (144.697 kg)  BMI 47.09 kg/m2  SpO2 97%       Review of Systems  Constitutional: Negative for fever, chills, appetite change and fatigue.  HENT: Negative for hearing loss, ear pain, congestion, sore throat, trouble swallowing, neck stiffness, dental  problem, voice change and tinnitus.   Eyes: Negative for pain, discharge and visual disturbance.  Respiratory: Negative for cough, chest tightness, wheezing and stridor.   Cardiovascular: Negative for chest pain, palpitations and leg swelling.  Gastrointestinal: Negative for nausea, vomiting, abdominal pain, diarrhea, constipation, blood in stool and abdominal distention.  Genitourinary: Negative for urgency, hematuria, flank pain, discharge, difficulty urinating and genital sores.  Musculoskeletal: Negative for myalgias, back pain, joint swelling, arthralgias and gait problem.  Skin: Negative for rash.  Neurological: Positive for numbness. Negative for dizziness, syncope, speech difficulty, weakness and headaches.  Hematological: Negative for adenopathy. Does not bruise/bleed easily.  Psychiatric/Behavioral: Negative for behavioral problems and dysphoric mood. The patient is not nervous/anxious.        Objective:   Physical Exam  Vitals reviewed. Constitutional: He is oriented to person, place, and time. He appears well-developed and well-nourished. No distress.  Weight 319 blood pressure 132/90   HENT:  Head: Normocephalic.  Right Ear: External ear normal.  Left Ear: External ear normal.  Eyes: Conjunctivae and EOM are normal.  Neck: Normal range of motion.  Cardiovascular: Normal rate and normal heart sounds.   Pulmonary/Chest: Breath sounds normal.  Abdominal: Bowel sounds are normal.  Musculoskeletal: Normal range of motion. He exhibits no edema and no tenderness.  Neurological: He is alert and oriented to person, place, and time.  Pedal pulses intact No abnormal sensory findings No difference to soft touch intact to monofilament testing and vibratory sensation  Psychiatric: He has a normal mood and affect. His behavior is normal.          Assessment & Plan:   Nonspecific dysesthesia right great toe. 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 Will observe at this time Obesity. Random blood sugar normal. No evidence of diabetes

## 2012-05-30 NOTE — Patient Instructions (Signed)
It is important that you exercise regularly, at least 20 minutes 3 to 4 times per week.  If you develop chest pain or shortness of breath seek  medical attention.  Smoking tobacco is very bad for your health. You should stop smoking immediately.  You need to lose weight.  Consider a lower calorie diet and regular exercise.  Call or return to clinic prn if these symptoms worsen or fail to improve as anticipated.

## 2013-05-22 ENCOUNTER — Other Ambulatory Visit (INDEPENDENT_AMBULATORY_CARE_PROVIDER_SITE_OTHER): Payer: BC Managed Care – PPO

## 2013-05-22 DIAGNOSIS — Z0000 Encounter for general adult medical examination without abnormal findings: Secondary | ICD-10-CM

## 2013-05-22 LAB — CBC WITH DIFFERENTIAL/PLATELET
BASOS ABS: 0 10*3/uL (ref 0.0–0.1)
BASOS PCT: 0.3 % (ref 0.0–3.0)
EOS ABS: 0.2 10*3/uL (ref 0.0–0.7)
Eosinophils Relative: 1.5 % (ref 0.0–5.0)
HCT: 46.5 % (ref 39.0–52.0)
HEMOGLOBIN: 15.3 g/dL (ref 13.0–17.0)
LYMPHS PCT: 22.8 % (ref 12.0–46.0)
Lymphs Abs: 3.1 10*3/uL (ref 0.7–4.0)
MCHC: 32.9 g/dL (ref 30.0–36.0)
MCV: 94.3 fl (ref 78.0–100.0)
MONOS PCT: 7.1 % (ref 3.0–12.0)
Monocytes Absolute: 1 10*3/uL (ref 0.1–1.0)
NEUTROS ABS: 9.2 10*3/uL — AB (ref 1.4–7.7)
NEUTROS PCT: 68.3 % (ref 43.0–77.0)
Platelets: 256 10*3/uL (ref 150.0–400.0)
RBC: 4.93 Mil/uL (ref 4.22–5.81)
RDW: 13.9 % (ref 11.5–14.6)
WBC: 13.4 10*3/uL — ABNORMAL HIGH (ref 4.5–10.5)

## 2013-05-22 LAB — POCT URINALYSIS DIPSTICK
BILIRUBIN UA: NEGATIVE
Blood, UA: NEGATIVE
Glucose, UA: NEGATIVE
KETONES UA: NEGATIVE
LEUKOCYTES UA: NEGATIVE
Nitrite, UA: NEGATIVE
Protein, UA: NEGATIVE
SPEC GRAV UA: 1.025
Urobilinogen, UA: 0.2
pH, UA: 5.5

## 2013-05-22 LAB — TSH: TSH: 1.64 u[IU]/mL (ref 0.35–5.50)

## 2013-05-22 LAB — HEPATIC FUNCTION PANEL
ALBUMIN: 3.7 g/dL (ref 3.5–5.2)
ALK PHOS: 57 U/L (ref 39–117)
ALT: 20 U/L (ref 0–53)
AST: 17 U/L (ref 0–37)
BILIRUBIN DIRECT: 0.1 mg/dL (ref 0.0–0.3)
TOTAL PROTEIN: 6.9 g/dL (ref 6.0–8.3)
Total Bilirubin: 0.9 mg/dL (ref 0.3–1.2)

## 2013-05-22 LAB — LIPID PANEL
CHOLESTEROL: 149 mg/dL (ref 0–200)
HDL: 50.4 mg/dL (ref >39.00)
LDL CALC: 85 mg/dL (ref 0–99)
TRIGLYCERIDES: 69 mg/dL (ref 0.0–149.0)
Total CHOL/HDL Ratio: 3
VLDL: 13.8 mg/dL (ref 0.0–40.0)

## 2013-05-22 LAB — BASIC METABOLIC PANEL
BUN: 15 mg/dL (ref 6–23)
CHLORIDE: 102 meq/L (ref 96–112)
CO2: 31 meq/L (ref 19–32)
Calcium: 9.4 mg/dL (ref 8.4–10.5)
Creatinine, Ser: 1.1 mg/dL (ref 0.4–1.5)
GFR: 78.66 mL/min (ref >60.00)
GLUCOSE: 104 mg/dL — AB (ref 70–99)
POTASSIUM: 5 meq/L (ref 3.5–5.1)
Sodium: 141 mEq/L (ref 135–145)

## 2013-05-22 LAB — PSA: PSA: 0.9 ng/mL (ref 0.10–4.00)

## 2013-05-29 ENCOUNTER — Encounter: Payer: Federal, State, Local not specified - PPO | Admitting: Internal Medicine

## 2013-06-05 ENCOUNTER — Encounter: Payer: Self-pay | Admitting: Internal Medicine

## 2013-06-05 ENCOUNTER — Ambulatory Visit (INDEPENDENT_AMBULATORY_CARE_PROVIDER_SITE_OTHER): Payer: BC Managed Care – PPO | Admitting: Internal Medicine

## 2013-06-05 VITALS — BP 130/80 | HR 95 | Temp 98.4°F | Ht 69.0 in | Wt 284.0 lb

## 2013-06-05 DIAGNOSIS — Z0000 Encounter for general adult medical examination without abnormal findings: Secondary | ICD-10-CM

## 2013-06-05 DIAGNOSIS — G2581 Restless legs syndrome: Secondary | ICD-10-CM

## 2013-06-05 DIAGNOSIS — I10 Essential (primary) hypertension: Secondary | ICD-10-CM

## 2013-06-05 DIAGNOSIS — F17200 Nicotine dependence, unspecified, uncomplicated: Secondary | ICD-10-CM

## 2013-06-05 DIAGNOSIS — E669 Obesity, unspecified: Secondary | ICD-10-CM

## 2013-06-05 MED ORDER — CLONAZEPAM 0.5 MG PO TABS: 0.5000 mg | ORAL_TABLET | Freq: Two times a day (BID) | ORAL | Status: DC | PRN

## 2013-06-05 MED ORDER — CARBIDOPA-LEVODOPA 25-100 MG PO TABS: 1.0000 | ORAL_TABLET | Freq: Every day | ORAL | Status: DC

## 2013-06-05 MED ORDER — ROPINIROLE HCL 4 MG PO TABS: 4.0000 mg | ORAL_TABLET | Freq: Every day | ORAL | Status: DC

## 2013-06-05 NOTE — Progress Notes (Signed)
Subjective:    Patient ID: Patrick [name], male    DOB: October 03, 1965, 48 y.o.   MRN: [id number]  HPI  48 year old patient who is seen today for a preventive health examination.  Problems include a history of hypertension.  This has more recently, controlled off medications, and a low salt diet.  He has a history of exogenous obesity.  Weight today is 284, down from 319 1 year ago.  Unfortunately, he has resumed tobacco use.  He has a history of a restless leg syndrome, which is managed with medications.  No new concerns or complaints.  Social history.  A truck driver.  Has a fianc.  He continues to smoke  History reviewed. No pertinent past medical history.  History   Social History  . Marital Status: Single    Spouse Name: N/A    Number of Children: N/A  . Years of Education: N/A   Occupational History  . Not on file.   Social History Main Topics  . Smoking status: Current Every Day Smoker    Types: Cigarettes    Last Attempt to Quit: 05/27/2012  . Smokeless tobacco: Never Used  . Alcohol Use: Yes     Comment: ocassionally  . Drug Use: No  . Sexual Activity: Not on file   Other Topics Concern  . Not on file   Social History Narrative  . No narrative on file    History reviewed. No pertinent past surgical history.  No family history on file.  No Known Allergies  Current Outpatient Prescriptions on File Prior to Visit  Medication Sig Dispense Refill  . carbidopa-levodopa (SINEMET IR) 25-100 MG per tablet Take 1 tablet by mouth at bedtime.  90 tablet  6  . naproxen (NAPROSYN) 500 MG tablet TAKE 1 TABLET ONCE OR TWICE A DAY AS NEEDED  180 tablet  0  . rOPINIRole (REQUIP) 4 MG tablet Take 1 tablet (4 mg total) by mouth at bedtime.  90 tablet  4   No current facility-administered medications on file prior to visit.    BP 130/80  Pulse 95  Temp(Src) 98.4 F (36.9 C) (Oral)  Ht 5\' 9"  (1.753 m)  Wt 284 lb (128.822 kg)  BMI 41.92 kg/m2  SpO2 96%     Review of  Systems  Constitutional: Negative for fever, chills, activity change, appetite change and fatigue.  HENT: Negative for congestion, dental problem, ear pain, hearing loss, mouth sores, rhinorrhea, sinus pressure, sneezing, tinnitus, trouble swallowing and voice change.   Eyes: Negative for photophobia, pain, redness and visual disturbance.  Respiratory: Negative for apnea, cough, choking, chest tightness, shortness of breath and wheezing.   Cardiovascular: Negative for chest pain, palpitations and leg swelling.  Gastrointestinal: Negative for nausea, vomiting, abdominal pain, diarrhea, constipation, blood in stool, abdominal distention, anal bleeding and rectal pain.  Genitourinary: Negative for dysuria, urgency, frequency, hematuria, flank pain, decreased urine volume, discharge, penile swelling, scrotal swelling, difficulty urinating, genital sores and testicular pain.  Musculoskeletal: Negative for arthralgias, back pain, gait problem, joint swelling, myalgias, neck pain and neck stiffness.  Skin: Negative for color change, rash and wound.  Neurological: Negative for dizziness, tremors, seizures, syncope, facial asymmetry, speech difficulty, weakness, light-headedness, numbness and headaches.  Hematological: Negative for adenopathy. Does not bruise/bleed easily.  Psychiatric/Behavioral: Negative for suicidal ideas, hallucinations, behavioral problems, confusion, sleep disturbance, self-injury, dysphoric mood, decreased concentration and agitation. The patient is not nervous/anxious.        Objective:   Physical Exam  Constitutional: He appears well-developed and well-nourished.  HENT:  Head: Normocephalic and atraumatic.  Right Ear: External ear normal.  Left Ear: External ear normal.  Nose: Nose normal.  Mouth/Throat: Oropharynx is clear and moist.  Eyes: Conjunctivae and EOM are normal. Pupils are equal, round, and reactive to light. No scleral icterus.  Neck: Normal range of motion.  Neck supple. No JVD present. No thyromegaly present.  Cardiovascular: Regular rhythm, normal heart sounds and intact distal pulses.  Exam reveals no gallop and no friction rub.   No murmur heard. Decreased right dorsalis pedis pulse  Pulmonary/Chest: Effort normal and breath sounds normal. He exhibits no tenderness.  Abdominal: Soft. Bowel sounds are normal. He exhibits no distension and no mass. There is no tenderness.  Genitourinary: Prostate normal and penis normal.  Musculoskeletal: Normal range of motion. He exhibits edema. He exhibits no tenderness.  Plus 1 ankle, and pedal edema  Lymphadenopathy:    He has no cervical adenopathy.  Neurological: He is alert. He has normal reflexes. No cranial nerve deficit. Coordination normal.  Skin: Skin is warm and dry. No rash noted.  Psychiatric: He has a normal mood and affect. His behavior is normal.          Assessment & Plan:  Exogenous obesity Preventive health examination.  Exercise, weight loss, better diet.  All encouraged Normotensive History of restless legs syndrome Ongoing tobacco use.  Total smoking cessation encouraged  Recheck one year Screening colonoscopy at that time

## 2013-06-05 NOTE — Patient Instructions (Addendum)
Limit your sodium (Salt) intake    It is important that you exercise regularly, at least 20 minutes 3 to 4 times per week.  If you develop chest pain or shortness of breath seek  medical attention.  You need to lose weight.  Consider a lower calorie diet and regular exercise.  Return in one year for follow-up  Smoking tobacco is very bad for your health. You should stop smoking immediately.   Preventive Care for Adults, Male A healthy lifestyle and preventive care can promote health and wellness. Preventive health guidelines for men include the following key practices:  A routine yearly physical is a good way to check with your health care provider about your health and preventative screening. It is a chance to share any concerns and updates on your health and to receive a thorough exam.  Visit your dentist for a routine exam and preventative care every 6 months. Brush your teeth twice a day and floss once a day. Good oral hygiene prevents tooth decay and gum disease.  The frequency of eye exams is based on your age, health, family medical history, use of contact lenses, and other factors. Follow your health care provider's recommendations for frequency of eye exams.  Eat a healthy diet. Foods such as vegetables, fruits, whole grains, low-fat dairy products, and lean protein foods contain the nutrients you need without too many calories. Decrease your intake of foods high in solid fats, added sugars, and salt. Eat the right amount of calories for you.Get information about a proper diet from your health care provider, if necessary.  Regular physical exercise is one of the most important things you can do for your health. Most adults should get at least 150 minutes of moderate-intensity exercise (any activity that increases your heart rate and causes you to sweat) each week. In addition, most adults need muscle-strengthening exercises on 2 or more days a week.  Maintain a healthy weight. The  body mass index (BMI) is a screening tool to identify possible weight problems. It provides an estimate of body fat based on height and weight. Your health care provider can find your BMI and can help you achieve or maintain a healthy weight.For adults 20 years and older:  A BMI below 18.5 is considered underweight.  A BMI of 18.5 to 24.9 is normal.  A BMI of 25 to 29.9 is considered overweight.  A BMI of 30 and above is considered obese.  Maintain normal blood lipids and cholesterol levels by exercising and minimizing your intake of saturated fat. Eat a balanced diet with plenty of fruit and vegetables. Blood tests for lipids and cholesterol should begin at age 41 and be repeated every 5 years. If your lipid or cholesterol levels are high, you are over 50, or you are at high risk for heart disease, you may need your cholesterol levels checked more frequently.Ongoing high lipid and cholesterol levels should be treated with medicines if diet and exercise are not working.  If you smoke, find out from your health care provider how to quit. If you do not use tobacco, do not start.  Lung cancer screening is recommended for adults aged 20 80 years who are at high risk for developing lung cancer because of a history of smoking. A yearly low-dose CT scan of the lungs is recommended for people who have at least a 30-pack-year history of smoking and are a current smoker or have quit within the past 15 years. A pack year of smoking  is smoking an average of 1 pack of cigarettes a day for 1 year (for example: 1 pack a day for 30 years or 2 packs a day for 15 years). Yearly screening should continue until the smoker has stopped smoking for at least 15 years. Yearly screening should be stopped for people who develop a health problem that would prevent them from having lung cancer treatment.  If you choose to drink alcohol, do not have more than 2 drinks per day. One drink is considered to be 12 ounces (355 mL) of  beer, 5 ounces (148 mL) of wine, or 1.5 ounces (44 mL) of liquor.  Avoid use of street drugs. Do not share needles with anyone. Ask for help if you need support or instructions about stopping the use of drugs.  High blood pressure causes heart disease and increases the risk of stroke. Your blood pressure should be checked at least every 1 2 years. Ongoing high blood pressure should be treated with medicines, if weight loss and exercise are not effective.  If you are 24 50 years old, ask your health care provider if you should take aspirin to prevent heart disease.  Diabetes screening involves taking a blood sample to check your fasting blood sugar level. This should be done once every 3 years, after age 44, if you are within normal weight and without risk factors for diabetes. Testing should be considered at a younger age or be carried out more frequently if you are overweight and have at least 1 risk factor for diabetes.  Colorectal cancer can be detected and often prevented. Most routine colorectal cancer screening begins at the age of 3 and continues through age 19. However, your health care provider may recommend screening at an earlier age if you have risk factors for colon cancer. On a yearly basis, your health care provider may provide home test kits to check for hidden blood in the stool. Use of a small camera at the end of a tube to directly examine the colon (sigmoidoscopy or colonoscopy) can detect the earliest forms of colorectal cancer. Talk to your health care provider about this at age 80, when routine screening begins. Direct exam of the colon should be repeated every 5 10 years through age 44, unless early forms of precancerous polyps or small growths are found.  People who are at an increased risk for hepatitis B should be screened for this virus. You are considered at high risk for hepatitis B if:  You were born in a country where hepatitis B occurs often. Talk with your health care  provider about which countries are considered high-risk.  Your parents were born in a high-risk country and you have not received a shot to protect against hepatitis B (hepatitis B vaccine).  You have HIV or AIDS.  You use needles to inject street drugs.  You live with, or have sex with, someone who has hepatitis B.  You are a man who has sex with other men (MSM).  You get hemodialysis treatment.  You take certain medicines for conditions such as cancer, organ transplantation, and autoimmune conditions.  Hepatitis C blood testing is recommended for all people born from [birth year] through 1965 and any individual with known risks for hepatitis C.  Practice safe sex. Use condoms and avoid high-risk sexual practices to reduce the spread of sexually transmitted infections (STIs). STIs include gonorrhea, chlamydia, syphilis, trichomonas, herpes, HPV, and human immunodeficiency virus (HIV). Herpes, HIV, and HPV are viral illnesses that have  no cure. They can result in disability, cancer, and death.  A one-time screening for abdominal aortic aneurysm (AAA) and surgical repair of large AAAs by ultrasound are recommended for men ages 44 to 65 years who are current or former smokers.  Healthy men should no longer receive prostate-specific antigen (PSA) blood tests as part of routine cancer screening. Talk with your health care provider about prostate cancer screening.  Testicular cancer screening is not recommended for adult males who have no symptoms. Screening includes self-exam, a health care provider exam, and other screening tests. Consult with your health care provider about any symptoms you have or any concerns you have about testicular cancer.  Use sunscreen. Apply sunscreen liberally and repeatedly throughout the day. You should seek shade when your shadow is shorter than you. Protect yourself by wearing long sleeves, pants, a wide-brimmed hat, and sunglasses year round, whenever you are  outdoors.  Once a month, do a whole-body skin exam, using a mirror to look at the skin on your back. Tell your health care provider about new moles, moles that have irregular borders, moles that are larger than a pencil eraser, or moles that have changed in shape or color.  Stay current with required vaccines (immunizations).  Influenza vaccine. All adults should be immunized every year.  Tetanus, diphtheria, and acellular pertussis (Td, Tdap) vaccine. An adult who has not previously received Tdap or who does not know his vaccine status should receive 1 dose of Tdap. This initial dose should be followed by tetanus and diphtheria toxoids (Td) booster doses every 10 years. Adults with an unknown or incomplete history of completing a 3-dose immunization series with Td-containing vaccines should begin or complete a primary immunization series including a Tdap dose. Adults should receive a Td booster every 10 years.  Varicella vaccine. An adult without evidence of immunity to varicella should receive 2 doses or a second dose if he has previously received 1 dose.  Human papillomavirus (HPV) vaccine. Males aged 89 21 years who have not received the vaccine previously should receive the 3-dose series. Males aged 83 26 years may be immunized. Immunization is recommended through the age of 26 years for any male who has sex with males and did not get any or all doses earlier. Immunization is recommended for any person with an immunocompromised condition through the age of 52 years if he did not get any or all doses earlier. During the 3-dose series, the second dose should be obtained 4 8 weeks after the first dose. The third dose should be obtained 24 weeks after the first dose and 16 weeks after the second dose.  Zoster vaccine. One dose is recommended for adults aged 65 years or older unless certain conditions are present.  Measles, mumps, and rubella (MMR) vaccine. Adults born before [birth year] generally are  considered immune to measles and mumps. Adults born in [birth year] or later should have 1 or more doses of MMR vaccine unless there is a contraindication to the vaccine or there is laboratory evidence of immunity to each of the three diseases. A routine second dose of MMR vaccine should be obtained at least 28 days after the first dose for students attending postsecondary schools, health care workers, or international travelers. People who received inactivated measles vaccine or an unknown type of measles vaccine during 1963 1967 should receive 2 doses of MMR vaccine. People who received inactivated mumps vaccine or an unknown type of mumps vaccine before 1979 and are at high risk for  mumps infection should consider immunization with 2 doses of MMR vaccine. Unvaccinated health care workers born before [birth year] who lack laboratory evidence of measles, mumps, or rubella immunity or laboratory confirmation of disease should consider measles and mumps immunization with 2 doses of MMR vaccine or rubella immunization with 1 dose of MMR vaccine.  Pneumococcal 13-valent conjugate (PCV13) vaccine. When indicated, a person who is uncertain of his immunization history and has no record of immunization should receive the PCV13 vaccine. An adult aged 71 years or older who has certain medical conditions and has not been previously immunized should receive 1 dose of PCV13 vaccine. This PCV13 should be followed with a dose of pneumococcal polysaccharide (PPSV23) vaccine. The PPSV23 vaccine dose should be obtained at least 8 weeks after the dose of PCV13 vaccine. An adult aged 56 years or older who has certain medical conditions and previously received 1 or more doses of PPSV23 vaccine should receive 1 dose of PCV13. The PCV13 vaccine dose should be obtained 1 or more years after the last PPSV23 vaccine dose.  Pneumococcal polysaccharide (PPSV23) vaccine. When PCV13 is also indicated, PCV13 should be obtained first. All adults aged 62  years and older should be immunized. An adult younger than age 43 years who has certain medical conditions should be immunized. Any person who resides in a nursing home or long-term care facility should be immunized. An adult smoker should be immunized. People with an immunocompromised condition and certain other conditions should receive both PCV13 and PPSV23 vaccines. People with human immunodeficiency virus (HIV) infection should be immunized as soon as possible after diagnosis. Immunization during chemotherapy or radiation therapy should be avoided. Routine use of PPSV23 vaccine is not recommended for American Indians, [place] Natives, or people younger than 65 years unless there are medical conditions that require PPSV23 vaccine. When indicated, people who have unknown immunization and have no record of immunization should receive PPSV23 vaccine. One-time revaccination 5 years after the first dose of PPSV23 is recommended for people aged 63 64 years who have chronic kidney failure, nephrotic syndrome, asplenia, or immunocompromised conditions. People who received 1 2 doses of PPSV23 before age 23 years should receive another dose of PPSV23 vaccine at age 74 years or later if at least 5 years have passed since the previous dose. Doses of PPSV23 are not needed for people immunized with PPSV23 at or after age 83 years.  Meningococcal vaccine. Adults with asplenia or persistent complement component deficiencies should receive 2 doses of quadrivalent meningococcal conjugate (MenACWY-D) vaccine. The doses should be obtained at least 2 months apart. Microbiologists working with certain meningococcal bacteria, [place] recruits, people at risk during an outbreak, and people who travel to or live in countries with a high rate of meningitis should be immunized. A first-year college student up through age 68 years who is living in a residence hall should receive a dose if he did not receive a dose on or after his 16th  birthday. Adults who have certain high-risk conditions should receive one or more doses of vaccine.  Hepatitis A vaccine. Adults who wish to be protected from this disease, have certain high-risk conditions, work with hepatitis A-infected animals, work in hepatitis A research labs, or travel to or work in countries with a high rate of hepatitis A should be immunized. Adults who were previously unvaccinated and who anticipate close contact with an international adoptee during the first 60 days after arrival in the [place] from a country with a high rate  of hepatitis A should be immunized.  Hepatitis B vaccine. Adults who wish to be protected from this disease, have certain high-risk conditions, may be exposed to blood or other infectious body fluids, are household contacts or sex partners of hepatitis B positive people, are clients or workers in certain care facilities, or travel to or work in countries with a high rate of hepatitis B should be immunized.  Haemophilus influenzae type b (Hib) vaccine. A previously unvaccinated person with asplenia or sickle cell disease or having a scheduled splenectomy should receive 1 dose of Hib vaccine. Regardless of previous immunization, a recipient of a hematopoietic stem cell transplant should receive a 3-dose series 6 12 months after his successful transplant. Hib vaccine is not recommended for adults with HIV infection. Preventive Service / Frequency Ages 32 to 36  Blood pressure check.** / Every 1 to 2 years.  Lipid and cholesterol check.** / Every 5 years beginning at age 54.  Hepatitis C blood test.** / For any individual with known risks for hepatitis C.  Skin self-exam. / Monthly.  Influenza vaccine. / Every year.  Tetanus, diphtheria, and acellular pertussis (Tdap, Td) vaccine.** / Consult your health care provider. 1 dose of Td every 10 years.  Varicella vaccine.** / Consult your health care provider.  HPV vaccine. / 3 doses over 6  months, if 73 or younger.  Measles, mumps, rubella (MMR) vaccine.** / You need at least 1 dose of MMR if you were born in 1957 or later. You may also need a second dose.  Pneumococcal 13-valent conjugate (PCV13) vaccine.** / Consult your health care provider.  Pneumococcal polysaccharide (PPSV23) vaccine.** / 1 to 2 doses if you smoke cigarettes or if you have certain conditions.  Meningococcal vaccine.** / 1 dose if you are age 31 to 72 years and a Market researcher living in a residence hall, or have one of several medical conditions. You may also need additional booster doses.  Hepatitis A vaccine.** / Consult your health care provider.  Hepatitis B vaccine.** / Consult your health care provider.  Haemophilus influenzae type b (Hib) vaccine.** / Consult your health care provider. Ages 43 to 84  Blood pressure check.** / Every 1 to 2 years.  Lipid and cholesterol check.** / Every 5 years beginning at age 60.  Lung cancer screening. / Every year if you are aged 12 80 years and have a 30-pack-year history of smoking and currently smoke or have quit within the past 15 years. Yearly screening is stopped once you have quit smoking for at least 15 years or develop a health problem that would prevent you from having lung cancer treatment.  Fecal occult blood test (FOBT) of stool. / Every year beginning at age 36 and continuing until age 53. You may not have to do this test if you get a colonoscopy every 10 years.  Flexible sigmoidoscopy** or colonoscopy.** / Every 5 years for a flexible sigmoidoscopy or every 10 years for a colonoscopy beginning at age 52 and continuing until age 23.  Hepatitis C blood test.** / For all people born from [birth year] through 1965 and any individual with known risks for hepatitis C.  Skin self-exam. / Monthly.  Influenza vaccine. / Every year.  Tetanus, diphtheria, and acellular pertussis (Tdap/Td) vaccine.** / Consult your health care provider. 1 dose of  Td every 10 years.  Varicella vaccine.** / Consult your health care provider.  Zoster vaccine.** / 1 dose for adults aged 54 years or older.  Measles, mumps,  rubella (MMR) vaccine.** / You need at least 1 dose of MMR if you were born in 1959 or later. You may also need a second dose.  Pneumococcal 13-valent conjugate (PCV13) vaccine.** / Consult your health care provider.  Pneumococcal polysaccharide (PPSV23) vaccine.** / 1 to 2 doses if you smoke cigarettes or if you have certain conditions.  Meningococcal vaccine.** / Consult your health care provider.  Hepatitis A vaccine.** / Consult your health care provider.  Hepatitis B vaccine.** / Consult your health care provider.  Haemophilus influenzae type b (Hib) vaccine.** / Consult your health care provider. Ages 92 and over  Blood pressure check.** / Every 1 to 2 years.  Lipid and cholesterol check.**/ Every 5 years beginning at age 26.  Lung cancer screening. / Every year if you are aged 26 80 years and have a 30-pack-year history of smoking and currently smoke or have quit within the past 15 years. Yearly screening is stopped once you have quit smoking for at least 15 years or develop a health problem that would prevent you from having lung cancer treatment.  Fecal occult blood test (FOBT) of stool. / Every year beginning at age 53 and continuing until age 75. You may not have to do this test if you get a colonoscopy every 10 years.  Flexible sigmoidoscopy** or colonoscopy.** / Every 5 years for a flexible sigmoidoscopy or every 10 years for a colonoscopy beginning at age 89 and continuing until age 14.  Hepatitis C blood test.** / For all people born from [birth year] through 1967 and any individual with known risks for hepatitis C.  Abdominal aortic aneurysm (AAA) screening.** / A one-time screening for ages 29 to 83 years who are current or former smokers.  Skin self-exam. / Monthly.  Influenza vaccine. / Every year.  Tetanus,  diphtheria, and acellular pertussis (Tdap/Td) vaccine.** / 1 dose of Td every 10 years.  Varicella vaccine.** / Consult your health care provider.  Zoster vaccine.** / 1 dose for adults aged 55 years or older.  Pneumococcal 13-valent conjugate (PCV13) vaccine.** / Consult your health care provider.  Pneumococcal polysaccharide (PPSV23) vaccine.** / 1 dose for all adults aged 107 years and older.  Meningococcal vaccine.** / Consult your health care provider.  Hepatitis A vaccine.** / Consult your health care provider.  Hepatitis B vaccine.** / Consult your health care provider.  Haemophilus influenzae type b (Hib) vaccine.** / Consult your health care provider. **Family history and personal history of risk and conditions may change your health care provider's recommendations. Document Released: 05/22/2001 Document Revised: 01/14/2013 Document Reviewed: 08/21/2010 [facility] [address] Patient Information 2014 [place], [place].

## 2013-06-05 NOTE — Progress Notes (Signed)
Pre visit review using our clinic review tool, if applicable. No additional management support is needed unless otherwise documented below in the visit note. 

## 2013-06-08 ENCOUNTER — Telehealth: Payer: Self-pay | Admitting: Internal Medicine

## 2013-06-08 NOTE — Telephone Encounter (Signed)
Relevant patient education mailed to patient.  

## 2013-06-08 NOTE — Telephone Encounter (Signed)
Relevant patient education assigned to patient using Emmi. ° °

## 2014-02-23 ENCOUNTER — Ambulatory Visit (INDEPENDENT_AMBULATORY_CARE_PROVIDER_SITE_OTHER): Payer: BC Managed Care – PPO | Admitting: Internal Medicine

## 2014-02-23 ENCOUNTER — Encounter: Payer: Self-pay | Admitting: Internal Medicine

## 2014-02-23 VITALS — BP 112/80 | HR 77 | Temp 98.6°F | Resp 20 | Ht 69.0 in | Wt 300.0 lb

## 2014-02-23 DIAGNOSIS — Z720 Tobacco use: Secondary | ICD-10-CM

## 2014-02-23 DIAGNOSIS — G2581 Restless legs syndrome: Secondary | ICD-10-CM

## 2014-02-23 DIAGNOSIS — E669 Obesity, unspecified: Secondary | ICD-10-CM

## 2014-02-23 DIAGNOSIS — R202 Paresthesia of skin: Secondary | ICD-10-CM

## 2014-02-23 DIAGNOSIS — F17200 Nicotine dependence, unspecified, uncomplicated: Secondary | ICD-10-CM

## 2014-02-23 DIAGNOSIS — Z23 Encounter for immunization: Secondary | ICD-10-CM

## 2014-02-23 DIAGNOSIS — R7302 Impaired glucose tolerance (oral): Secondary | ICD-10-CM

## 2014-02-23 MED ORDER — CLONAZEPAM 0.5 MG PO TABS: 0.5000 mg | ORAL_TABLET | Freq: Two times a day (BID) | ORAL | Status: DC | PRN

## 2014-02-23 MED ORDER — BUPROPION HCL ER (SR) 150 MG PO TB12
150.0000 mg | ORAL_TABLET | Freq: Two times a day (BID) | ORAL | Status: DC
Start: 2014-02-23 — End: 2014-06-18

## 2014-02-23 MED ORDER — NAPROXEN 500 MG PO TABS: ORAL_TABLET | ORAL | Status: DC

## 2014-02-23 NOTE — Patient Instructions (Addendum)
Dietary consultation as discussed Neurology consultation  Call or return to clinic prn if these symptoms worsen or fail to improve as anticipated.

## 2014-02-23 NOTE — Progress Notes (Signed)
Pre visit review using our clinic review tool, if applicable. No additional management support is needed unless otherwise documented below in the visit note. 

## 2014-02-23 NOTE — Progress Notes (Signed)
Subjective:    Patient ID: Patrick [name], male    DOB: [birth year], 48 y.o.   MRN: [id number]  HPI 48 year old patient who presents with a four-day history of numbness involving both feet.  He also describes a tingling sensation.  Symptoms fluctuate and at times his feet feel normal.  Over this period of time.  He also has noted some mild paresthesias of both hands.  He denies any motor weakness. 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 Medical problems include exogenous obesity and ongoing tobacco use.   Patient does have a history of impaired glucose tolerance.  He was quite concerned about recent symptoms related to diabetes  No past medical history on file.  History   Social History  . Marital Status: Single    Spouse Name: N/A    Number of Children: N/A  . Years of Education: N/A   Occupational History  . Not on file.   Social History Main Topics  . Smoking status: Current Every Day Smoker    Types: Cigarettes    Last Attempt to Quit: 05/27/2012  . Smokeless tobacco: Never Used  . Alcohol Use: Yes     Comment: ocassionally  . Drug Use: No  . Sexual Activity: Not on file   Other Topics Concern  . Not on file   Social History Narrative    No past surgical history on file.  No family history on file.  Allergies  Allergen Reactions  . Banana Nausea And Vomiting    Current Outpatient Prescriptions on File Prior to Visit  Medication Sig Dispense Refill  . carbidopa-levodopa (SINEMET IR) 25-100 MG per tablet Take 1 tablet by mouth at bedtime. 90 tablet 6  . rOPINIRole (REQUIP) 4 MG tablet Take 1 tablet (4 mg total) by mouth at bedtime. 90 tablet 4   No current facility-administered medications on file prior to visit.    BP 112/80 mmHg  Pulse 77  Temp(Src) 98.6 F (37 C) (Oral)  Resp 20  Ht 5\' 9"  (1.753 m)  Wt 300 lb (136.079 kg)  BMI 44.28 kg/m2  SpO2 97%       Review of Systems  Constitutional: Negative for fever, chills, appetite  change and fatigue.  HENT: Negative for congestion, dental problem, ear pain, hearing loss, sore throat, tinnitus, trouble swallowing and voice change.   Eyes: Negative for pain, discharge and visual disturbance.  Respiratory: Negative for cough, chest tightness, wheezing and stridor.   Cardiovascular: Negative for chest pain, palpitations and leg swelling.  Gastrointestinal: Negative for nausea, vomiting, abdominal pain, diarrhea, constipation, blood in stool and abdominal distention.  Genitourinary: Negative for urgency, hematuria, flank pain, discharge, difficulty urinating and genital sores.  Musculoskeletal: Negative for myalgias, back pain, joint swelling, arthralgias, gait problem and neck stiffness.  Skin: Negative for rash.  Neurological: Positive for numbness. Negative for dizziness, syncope, speech difficulty, weakness and headaches.  Hematological: Negative for adenopathy. Does not bruise/bleed easily.  Psychiatric/Behavioral: Negative for behavioral problems and dysphoric mood. The patient is not nervous/anxious.        Objective:   Physical Exam  Constitutional:  No distress Weight 300 pounds Blood pressure 112/80  Musculoskeletal:  Negative Tinel's  Neurological:  Normal grip strength Able to walk on toes and heels without difficulty Intact to monofilament testing and vibratory sensation and soft touch Reflexes somewhat blunted but intact          Assessment & Plan:   Paresthesias.  Unclear etiology.  Symptoms  are fairly recent and fluctuate.  Clinical exam is unremarkable, we'll clinically observe at this time Restless leg syndrome.  The patient has been on a number of therapeutic medications and still is quite symptomatic.  He is asking about further therapeutic options.  Will schedule to see neurology Morbid obesity.  Will set up for dietary counseling Impaired glucose tolerance.  Implications discussed at length

## 2014-02-26 ENCOUNTER — Ambulatory Visit: Payer: BC Managed Care – PPO | Admitting: Internal Medicine

## 2014-03-03 ENCOUNTER — Encounter: Payer: Self-pay | Admitting: Internal Medicine

## 2014-03-03 ENCOUNTER — Ambulatory Visit (INDEPENDENT_AMBULATORY_CARE_PROVIDER_SITE_OTHER): Payer: BC Managed Care – PPO | Admitting: Internal Medicine

## 2014-03-03 VITALS — BP 154/86 | HR 80 | Temp 97.9°F | Ht 69.0 in | Wt 305.0 lb

## 2014-03-03 DIAGNOSIS — I10 Essential (primary) hypertension: Secondary | ICD-10-CM

## 2014-03-03 DIAGNOSIS — Z205 Contact with and (suspected) exposure to viral hepatitis: Secondary | ICD-10-CM

## 2014-03-03 NOTE — Progress Notes (Signed)
Subjective:    Patient ID: Patrick [name], male    DOB: 01-Jan-1966, 48 y.o.   MRN: [id number]  HPI 48 year old patient who has treated hypertension.  His fiance has had a recent diagnosis of chronic hepatitis C.  He was concerned about possible transmission. Asymptomatic  No past medical history on file.  History   Social History  . Marital Status: Single    Spouse Name: N/A    Number of Children: N/A  . Years of Education: N/A   Occupational History  . Not on file.   Social History Main Topics  . Smoking status: Current Every Day Smoker    Types: Cigarettes    Last Attempt to Quit: 05/27/2012  . Smokeless tobacco: Never Used  . Alcohol Use: Yes     Comment: ocassionally  . Drug Use: No  . Sexual Activity: Not on file   Other Topics Concern  . Not on file   Social History Narrative    No past surgical history on file.  No family history on file.  Allergies  Allergen Reactions  . Banana Nausea And Vomiting    Current Outpatient Prescriptions on File Prior to Visit  Medication Sig Dispense Refill  . buPROPion (WELLBUTRIN SR) 150 MG 12 hr tablet Take 1 tablet (150 mg total) by mouth 2 (two) times daily. 180 tablet 1  . carbidopa-levodopa (SINEMET IR) 25-100 MG per tablet Take 1 tablet by mouth at bedtime. 90 tablet 6  . clonazePAM (KLONOPIN) 0.5 MG tablet Take 1 tablet (0.5 mg total) by mouth 2 (two) times daily as needed for anxiety. 60 tablet 5  . naproxen (NAPROSYN) 500 MG tablet TAKE 1 TABLET ONCE OR TWICE A DAY AS NEEDED 180 tablet 1  . rOPINIRole (REQUIP) 4 MG tablet Take 1 tablet (4 mg total) by mouth at bedtime. 90 tablet 4   No current facility-administered medications on file prior to visit.    BP 154/86 mmHg  Pulse 80  Temp(Src) 97.9 F (36.6 C) (Oral)  Ht 5\' 9"  (1.753 m)  Wt 305 lb (138.347 kg)  BMI 45.02 kg/m2      Review of Systems  Constitutional: Negative for fever, chills, appetite change and fatigue.  HENT: Negative for  congestion, dental problem, ear pain, hearing loss, sore throat, tinnitus, trouble swallowing and voice change.   Eyes: Negative for pain, discharge and visual disturbance.  Respiratory: Negative for cough, chest tightness, wheezing and stridor.   Cardiovascular: Negative for chest pain, palpitations and leg swelling.  Gastrointestinal: Negative for nausea, vomiting, abdominal pain, diarrhea, constipation, blood in stool and abdominal distention.  Genitourinary: Negative for urgency, hematuria, flank pain, discharge, difficulty urinating and genital sores.  Musculoskeletal: Negative for myalgias, back pain, joint swelling, arthralgias, gait problem and neck stiffness.  Skin: Negative for rash.  Neurological: Negative for dizziness, syncope, speech difficulty, weakness, numbness and headaches.  Hematological: Negative for adenopathy. Does not bruise/bleed easily.  Psychiatric/Behavioral: Negative for behavioral problems and dysphoric mood. The patient is not nervous/anxious.        Objective:   Physical Exam  Constitutional: He is oriented to person, place, and time. He appears well-developed.  HENT:  Head: Normocephalic.  Right Ear: External ear normal.  Left Ear: External ear normal.  Eyes: Conjunctivae and EOM are normal.  Neck: Normal range of motion.  Cardiovascular: Normal rate and normal heart sounds.   Pulmonary/Chest: Breath sounds normal.  Abdominal: Soft. Bowel sounds are normal. He exhibits no distension. There is no tenderness.  There is no rebound and no guarding.  No liver enlargement  Musculoskeletal: Normal range of motion. He exhibits no edema or tenderness.  Neurological: He is alert and oriented to person, place, and time.  Psychiatric: He has a normal mood and affect. His behavior is normal.          Assessment & Plan:   Exposure to hepatitis C.  Will check an anti-hepatitis C antibody. Safe sex.  Encouraged.  His fiance is to be evaluated by infectious  disease Hypertension, stable

## 2014-03-03 NOTE — Progress Notes (Signed)
Pre visit review using our clinic review tool, if applicable. No additional management support is needed unless otherwise documented below in the visit note. 

## 2014-03-03 NOTE — Patient Instructions (Signed)
Call or return to clinic prn if these symptoms worsen or fail to improve as anticipated.

## 2014-03-04 LAB — HEPATITIS C ANTIBODY: HCV AB: NEGATIVE

## 2014-03-10 ENCOUNTER — Telehealth: Payer: Self-pay | Admitting: Internal Medicine

## 2014-03-10 NOTE — Telephone Encounter (Signed)
emmi emailed °

## 2014-04-16 ENCOUNTER — Ambulatory Visit (INDEPENDENT_AMBULATORY_CARE_PROVIDER_SITE_OTHER): Payer: BLUE CROSS/BLUE SHIELD | Admitting: Neurology

## 2014-04-16 ENCOUNTER — Encounter: Payer: Self-pay | Admitting: Neurology

## 2014-04-16 ENCOUNTER — Ambulatory Visit: Payer: BC Managed Care – PPO | Admitting: Skilled Nursing Facility1

## 2014-04-16 ENCOUNTER — Telehealth: Payer: Self-pay | Admitting: Neurology

## 2014-04-16 ENCOUNTER — Ambulatory Visit: Payer: BC Managed Care – PPO | Admitting: Dietician

## 2014-04-16 VITALS — BP 144/88 | HR 88 | Ht 69.0 in | Wt 307.0 lb

## 2014-04-16 DIAGNOSIS — F17200 Nicotine dependence, unspecified, uncomplicated: Secondary | ICD-10-CM

## 2014-04-16 DIAGNOSIS — Z720 Tobacco use: Secondary | ICD-10-CM

## 2014-04-16 DIAGNOSIS — G2581 Restless legs syndrome: Secondary | ICD-10-CM

## 2014-04-16 MED ORDER — CARBIDOPA-LEVODOPA 25-100 MG PO TABS: 1.0000 | ORAL_TABLET | Freq: Every evening | ORAL | Status: DC | PRN

## 2014-04-16 NOTE — Progress Notes (Signed)
[facility] Clinic Note - Initial Visit   Date: 04/16/2014  KEMANI DEMARAIS MRN: [id number] DOB: Feb 17, 1966   Dear Dr. Burnice Logan:  Thank you for your kind referral of [facility] for consultation of RLS. Although his history is well known to you, please allow [place] to reiterate it for the purpose of our medical record. The patient was accompanied to the clinic by self.     History of Present Illness: COOLIDGE GOSSARD is a 49 y.o. right-handed Caucasian male with restless leg syndrome and tobacco abuse presenting for evaluation of worsening leg discomfort.    He reports having a long history of bilateral upper thigh discomfort for at least the past 20 years.  Pain is described as "crawling sensation, tiny cramps".  Pain is localized at the lateral thighs bilaterally. Walking and squatting seems to alleviate discomfort.  He has noticed that chocolate and stress exacerbates symptoms.  He has not noticed a significant worsening of symptoms at rest, but pain is more bothersome at night.  He has been on requip for at least the past 10 years and has been on requip 4mg  which makes him very sleepy.  He was started on sinemet 25/100  1 tablet daily.  He was previously taking 1 tablet as needed per night.  Over the last 6-8 months, he was stressed due to personal relationship issues and started taking 3-4 tablets per night.  Since moving out and living separate last week, his leg discomfort has improved and he has only needed to take it twice over the past week.  He attributes the severe worsening of discomfort to his level of stress now that he is doing much better.   Out-side paper records, electronic medical record, and images have been reviewed where available and summarized as:  Lab Results  Component Value Date   TSH 1.64 05/22/2013   Lab Results  Component Value Date   FERRITIN 26.9 04/29/2006     Past Medical History  Diagnosis Date  . RLS (restless legs  syndrome)   . Tobacco use     Past Surgical History  Procedure Laterality Date  . Cholecystectomy    . Mandible fracture surgery  1984    MVI  . Arm surgery  1984    MVI  . Hip surgery  1984    MVI  . Kidney surgery  1984    MVI  . Cardiac surgery  1984    MVI     Medications:  Current Outpatient Prescriptions on File Prior to Visit  Medication Sig Dispense Refill  . buPROPion (WELLBUTRIN SR) 150 MG 12 hr tablet Take 1 tablet (150 mg total) by mouth 2 (two) times daily. 180 tablet 1  . carbidopa-levodopa (SINEMET IR) 25-100 MG per tablet Take 1 tablet by mouth at bedtime. (Patient taking differently: Take 1 tablet by mouth 3 (three) times daily. ) 90 tablet 6  . clonazePAM (KLONOPIN) 0.5 MG tablet Take 1 tablet (0.5 mg total) by mouth 2 (two) times daily as needed for anxiety. 60 tablet 5  . naproxen (NAPROSYN) 500 MG tablet TAKE 1 TABLET ONCE OR TWICE A DAY AS NEEDED 180 tablet 1  . rOPINIRole (REQUIP) 4 MG tablet Take 1 tablet (4 mg total) by mouth at bedtime. 90 tablet 4   No current facility-administered medications on file prior to visit.    Allergies:  Allergies  Allergen Reactions  . Banana Nausea And Vomiting    Family History: Family History  Problem Relation Age of Onset  . Hearing loss Mother     Living  . Leukemia Father 46    Due to agent orange, deceased  . Diabetes Brother   . Obesity Brother   . Healthy Brother   . Alcohol abuse Sister     Social History: History   Social History  . Marital Status: Single    Spouse Name: N/A    Number of Children: N/A  . Years of Education: N/A   Occupational History  . Not on file.   Social History Main Topics  . Smoking status: Current Every Day Smoker -- 1.00 packs/day for 35 years    Types: Cigarettes  . Smokeless tobacco: Never Used     Comment: cut back from 1 pack to 1/2 pack day - 35x years  . Alcohol Use: 0.0 oz/week    0 Not specified per week     Comment: twice a month  . Drug Use: No    . Sexual Activity: Not on file   Other Topics Concern  . 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   Highest level of education:  High school   He lives alone.  He has one son.     Review of Systems:  CONSTITUTIONAL: No fevers, chills, night sweats, or weight loss.   EYES: No visual changes or eye pain ENT: No hearing changes.  No history of nose bleeds.   RESPIRATORY: No cough, wheezing and shortness of breath.   CARDIOVASCULAR: Negative for chest pain, and palpitations.   GI: Negative for abdominal discomfort, blood in stools or black stools.  No recent change in bowel habits.   GU:  No history of incontinence.   MUSCLOSKELETAL: No history of joint pain or swelling.  No myalgias.   SKIN: Negative for lesions, rash, and itching.   HEMATOLOGY/ONCOLOGY: Negative for prolonged bleeding, bruising easily, and swollen nodes.   ENDOCRINE: Negative for cold or heat intolerance, polydipsia or goiter.   PSYCH:  No depression or anxiety symptoms.   NEURO: As Above.   Vital Signs:  BP 144/88 mmHg  Pulse 88  Ht 5\' 9"  (1.753 m)  Wt 307 lb (139.254 kg)  BMI 45.32 kg/m2   General Medical Exam:   General:  Obese, well-appearing, comfortable.   Eyes/ENT: see cranial nerve examination.   Neck: No masses appreciated.  Full range of motion without tenderness.  No carotid bruits. Respiratory:  Clear to auscultation, good air entry bilaterally.   Cardiac:  Regular rate and rhythm, no murmur.   Extremities:  No deformities, edema, or skin discoloration.  Skin:  No rashes or lesions.  Neurological Exam: MENTAL STATUS including orientation to time, place, person, recent and remote memory, attention span and concentration, language, and fund of knowledge is normal.  Speech is not dysarthric.  CRANIAL NERVES: II:  No visual field defects.  Unremarkable fundi.   III-IV-VI: Pupils equal round and reactive to light.  Normal  conjugate, extra-ocular eye movements in all directions of gaze.  No nystagmus.  No ptosis.   V:  Normal facial sensation.    VII:  Normal facial symmetry and movements.  No pathologic facial reflexes.  VIII:  Normal hearing and vestibular function.   IX-X:  Normal palatal movement.   XI:  Normal shoulder shrug and head rotation.   XII:  Normal tongue strength and range of motion, no deviation or  fasciculation.  MOTOR:  No atrophy, fasciculations or abnormal movements.  No pronator drift.  Tone is normal.    Right Upper Extremity:    Left Upper Extremity:    Deltoid  5/5   Deltoid  5/5   Biceps  5/5   Biceps  5/5   Triceps  5/5   Triceps  5/5   Wrist extensors  5/5   Wrist extensors  5/5   Wrist flexors  5/5   Wrist flexors  5/5   Finger extensors  5/5   Finger extensors  5/5   Finger flexors  5/5   Finger flexors  5/5   Dorsal interossei  5/5   Dorsal interossei  5/5   Abductor pollicis  5/5   Abductor pollicis  5/5   Tone ([place]worth scale)  0  Tone ([place]worth scale)  0   Right Lower Extremity:    Left Lower Extremity:    Hip flexors  5/5   Hip flexors  5/5   Hip extensors  5/5   Hip extensors  5/5   Knee flexors  5/5   Knee flexors  5/5   Knee extensors  5/5   Knee extensors  5/5   Dorsiflexors  5/5   Dorsiflexors  5/5   Plantarflexors  5/5   Plantarflexors  5/5   Toe extensors  5/5   Toe extensors  5/5   Toe flexors  5/5   Toe flexors  5/5   Tone (Ashworth scale)  0  Tone (Ashworth scale)  0   MSRs:  Right                                                                 Left brachioradialis 2+  brachioradialis 2+  biceps 2+  biceps 2+  triceps 2+  triceps 2+  patellar 2+  patellar 2+  ankle jerk 2+  ankle jerk 2+  Hoffman no  Hoffman no  plantar response down  plantar response down   SENSORY:  Normal and symmetric perception of light touch, pinprick, vibration, and proprioception.  Romberg's sign absent.   COORDINATION/GAIT: Normal finger-to- nose-finger and heel-to-shin.   Intact rapid alternating movements bilaterally.  Able to rise from a chair without using arms.  Gait narrow based and stable. Tandem and stressed gait intact.    IMPRESSION/PLAN: 1.  Restless leg syndrome, clinically stabilized now that his psychosocial stress has improved  - Continue requip at 4mg  bedtime, cautious with increasing the dose due to sedation  - Take sinemet 25/100 1 tablet prn for severe RLS  - Check ferritin   2.  Morbid obesity  - Encouraged weight loss  3.  Tobacco use  - Taking Wellbutrin to help with tobacco cessation  4.  Return to clinic as needed   The duration of this appointment visit was 40 minutes of face-to-face time with the patient.  Greater than 50% of this time was spent in counseling, explanation of diagnosis, planning of further management, and coordination of care.   Thank you for allowing me to participate in patient's care.  If I can answer any additional questions, I would be pleased to do so.    Sincerely,    Wrenley Sayed K. Posey Pronto, DO

## 2014-04-16 NOTE — Telephone Encounter (Signed)
Pt came by the office at 3:03PM. He was seen today as a NP and went down stairs to [facility] for his blood work. Pt wanted to let Dr. Posey Pronto Know that pt was not going to pay another $30 copay for blood work when he just spent $70 for his copay with Dr. Posey Pronto.  C/b [phone]

## 2014-04-16 NOTE — Telephone Encounter (Signed)
Can he go to [facility] lab?

## 2014-04-16 NOTE — Patient Instructions (Signed)
1.  Continue requip at 4mg  bedtime 2.  Take sinemet 25/100 1 tablet prn for severe RLS 3.  Check ferritin  4.  Return to clinic as needed

## 2014-04-16 NOTE — Telephone Encounter (Signed)
FYI

## 2014-04-21 ENCOUNTER — Telehealth: Payer: Self-pay | Admitting: Neurology

## 2014-04-21 NOTE — Telephone Encounter (Signed)
Pt called/returning your call at 3:51PM. C/b [phone]

## 2014-04-21 NOTE — Telephone Encounter (Signed)
Patient will go to the [facility] lab.

## 2014-04-21 NOTE — Telephone Encounter (Signed)
See next note

## 2014-04-23 ENCOUNTER — Other Ambulatory Visit: Payer: BLUE CROSS/BLUE SHIELD

## 2014-04-23 DIAGNOSIS — G2581 Restless legs syndrome: Secondary | ICD-10-CM

## 2014-04-23 LAB — FERRITIN: FERRITIN: 136 ng/mL (ref 22–322)

## 2014-05-14 ENCOUNTER — Encounter: Payer: BLUE CROSS/BLUE SHIELD | Attending: Internal Medicine | Admitting: Skilled Nursing Facility1

## 2014-05-14 ENCOUNTER — Encounter: Payer: Self-pay | Admitting: Skilled Nursing Facility1

## 2014-05-14 VITALS — Ht 69.0 in | Wt 310.0 lb

## 2014-05-14 DIAGNOSIS — Z713 Dietary counseling and surveillance: Secondary | ICD-10-CM | POA: Diagnosis not present

## 2014-05-14 DIAGNOSIS — E669 Obesity, unspecified: Secondary | ICD-10-CM | POA: Insufficient documentation

## 2014-05-14 DIAGNOSIS — Z6841 Body Mass Index (BMI) 40.0 and over, adult: Secondary | ICD-10-CM | POA: Insufficient documentation

## 2014-05-14 NOTE — Progress Notes (Signed)
  Medical Nutrition Therapy:  Appt start time: 9:45 end time:  10:45.   Assessment:  Primary concerns today: Referred for hypertension. Patient states he wants to lose weight but he cannot lose weight and quit smoking at the same time. 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 Patient states he has tried a gym for a week working out for an hour and a half but that was cutting into his sleep. 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 Patient states he smokes about a pack a day and rarely drinks. This dietitian advised he try to begin weaning himself off of smoking as soon as he can due to its unhealthful implications.  He is Very talkative but respectful and seems to be very motivated. Preferred Learning Style:   Auditory  Learning Readiness:   Change in progress   MEDICATIONS: See List   DIETARY INTAKE:  Usual eating pattern includes 3 meals and 3 snacks per day.  Everyday foods include 3.  Avoided foods include 3.    Just started this week and may not last** 24-hr recall:  B ( AM): 2 sandwichs------soup with half sandwich Snk ( AM): apples L ( PM):2 bowls of cereal with 2% milk Snk ( PM): peppers, tomatoes D ( PM): 2 wraps, 1 can of sardines with crackers, 4 sugar free cream wafers   Snk ( PM): carrots Beverages: water with sugar free packets  Usual physical activity: ADL's  Estimated energy needs: 2000 calories 225 g carbohydrates 150 g protein 56 g fat  Progress Towards Goal(s):  In progress.   Nutritional Diagnosis:  NB-1.1 Food and nutrition-related knowledge deficit As related to no prior nutrition education.  As evidenced by BMI of 45.9 and large portion  sizes.    Intervention:  Nutrition Counseling for hypertension/obesity. Dietitian educated the patient on portion sizes, meal planning, sodium intake, and physical activity. Dietitian also educated the patient on the importance of quitting smoking.  Goals:  -For your cereal add nuts and fruit -Continue to snack throughout the day -Try to be physically active everyday, get to the gym to walk -Try some hummus -Try only eating one wrap at lunch -Try the light sodium soy sauce  Teaching Method Utilized:  Visual Auditory   Handouts given during visit include:  15 gram carbohydrate snacks  MyPlate  Low sodium nutrition  Hypertension nutrition  Barriers to learning/adherence to lifestyle change: Low work hours  Demonstrated degree of understanding via:  Teach Back   Monitoring/Evaluation:  Dietary intake, exercise, and body weight prn.

## 2014-05-14 NOTE — Patient Instructions (Addendum)
-  For your cereal add nuts and fruit -Continue to snack throughout the day -Try to be physically active everyday; get to the gym to walk -Try some hummus -Try only eating one wrap at lunch -Try the light sodium soy sauce

## 2014-05-26 ENCOUNTER — Ambulatory Visit: Payer: BLUE CROSS/BLUE SHIELD | Admitting: Internal Medicine

## 2014-06-04 ENCOUNTER — Other Ambulatory Visit: Payer: BC Managed Care – PPO

## 2014-06-07 ENCOUNTER — Other Ambulatory Visit: Payer: Self-pay | Admitting: Internal Medicine

## 2014-06-11 ENCOUNTER — Encounter: Payer: BLUE CROSS/BLUE SHIELD | Admitting: Internal Medicine

## 2014-06-11 ENCOUNTER — Telehealth: Payer: Self-pay | Admitting: *Deleted

## 2014-06-11 NOTE — Telephone Encounter (Signed)
Patient left earlier today prior to his scheduled annual exam.  Attempted to discuss situation with the patient who stated  "I do not want to talk right now" .  He was asked to call the office to reschedule at his convenience, but he hung up and it is unclear whether he got this message

## 2014-06-11 NOTE — Telephone Encounter (Signed)
Pt was schedule for Physical today at 1:15 with you, when I brought him back and got his weight (312) patient got upset and said he was leaving. I tried to have the pt stay but he insisted that he was leaving stated he has been on a diet for 2 weeks, no red meat, no burgers etc, and gained 5 pounds. I told him Dr. Raliegh Ip can talk to him. Pt said no and left.

## 2014-06-18 ENCOUNTER — Encounter: Payer: Self-pay | Admitting: Internal Medicine

## 2014-06-18 ENCOUNTER — Ambulatory Visit (INDEPENDENT_AMBULATORY_CARE_PROVIDER_SITE_OTHER): Payer: BLUE CROSS/BLUE SHIELD | Admitting: Internal Medicine

## 2014-06-18 VITALS — BP 130/90 | HR 79 | Temp 97.8°F | Resp 20 | Ht 69.0 in | Wt 310.0 lb

## 2014-06-18 DIAGNOSIS — E669 Obesity, unspecified: Secondary | ICD-10-CM

## 2014-06-18 DIAGNOSIS — G2581 Restless legs syndrome: Secondary | ICD-10-CM

## 2014-06-18 DIAGNOSIS — Z0000 Encounter for general adult medical examination without abnormal findings: Secondary | ICD-10-CM

## 2014-06-18 LAB — CBC WITH DIFFERENTIAL/PLATELET
Basophils Absolute: 0 10*3/uL (ref 0.0–0.1)
Basophils Relative: 0.4 % (ref 0.0–3.0)
Eosinophils Absolute: 0.3 10*3/uL (ref 0.0–0.7)
Eosinophils Relative: 2.7 % (ref 0.0–5.0)
HCT: 43.9 % (ref 39.0–52.0)
Hemoglobin: 15 g/dL (ref 13.0–17.0)
LYMPHS PCT: 30 % (ref 12.0–46.0)
Lymphs Abs: 3.2 10*3/uL (ref 0.7–4.0)
MCHC: 34.1 g/dL (ref 30.0–36.0)
MCV: 89.3 fl (ref 78.0–100.0)
MONOS PCT: 7.8 % (ref 3.0–12.0)
Monocytes Absolute: 0.8 10*3/uL (ref 0.1–1.0)
NEUTROS ABS: 6.3 10*3/uL (ref 1.4–7.7)
Neutrophils Relative %: 59.1 % (ref 43.0–77.0)
Platelets: 219 10*3/uL (ref 150.0–400.0)
RBC: 4.91 Mil/uL (ref 4.22–5.81)
RDW: 14 % (ref 11.5–15.5)
WBC: 10.6 10*3/uL — AB (ref 4.0–10.5)

## 2014-06-18 LAB — COMPREHENSIVE METABOLIC PANEL
ALT: 13 U/L (ref 0–53)
AST: 17 U/L (ref 0–37)
Albumin: 3.9 g/dL (ref 3.5–5.2)
Alkaline Phosphatase: 57 U/L (ref 39–117)
BILIRUBIN TOTAL: 0.8 mg/dL (ref 0.2–1.2)
BUN: 17 mg/dL (ref 6–23)
CO2: 29 meq/L (ref 19–32)
CREATININE: 0.97 mg/dL (ref 0.40–1.50)
Calcium: 9.2 mg/dL (ref 8.4–10.5)
Chloride: 101 mEq/L (ref 96–112)
GFR: 86.76 mL/min (ref >60.00)
Glucose, Bld: 100 mg/dL — ABNORMAL HIGH (ref 70–99)
Potassium: 3.8 mEq/L (ref 3.5–5.1)
Sodium: 136 mEq/L (ref 135–145)
Total Protein: 7.1 g/dL (ref 6.0–8.3)

## 2014-06-18 LAB — LIPID PANEL
CHOLESTEROL: 134 mg/dL (ref 0–200)
HDL: 46.3 mg/dL (ref >39.00)
LDL Cholesterol: 69 mg/dL (ref 0–99)
NonHDL: 87.7
TRIGLYCERIDES: 95 mg/dL (ref 0.0–149.0)
Total CHOL/HDL Ratio: 3
VLDL: 19 mg/dL (ref 0.0–40.0)

## 2014-06-18 LAB — HEMOGLOBIN A1C: Hgb A1c MFr Bld: 6.4 % (ref 4.6–6.5)

## 2014-06-18 LAB — TSH: TSH: 1.73 u[IU]/mL (ref 0.35–4.50)

## 2014-06-18 NOTE — Patient Instructions (Signed)
Limit your sodium (Salt) intake    It is important that you exercise regularly, at least 20 minutes 3 to 4 times per week.  If you develop chest pain or shortness of breath seek  medical attention. 

## 2014-06-18 NOTE — Progress Notes (Signed)
Subjective:    Patient ID: Patrick [name], male    DOB: 29-Jun-1965, 49 y.o.   MRN: [id number]  HPI  49 year old patient who is seen today for follow-up.  He has essential hypertension, restless leg syndrome and obesity.  He has impaired glucose tolerance.  He was scheduled for a physical recently, but was frustrated with lack of weight loss and left without being seen.  He has seen a dietitian recently and has been working on his diet Generally feels well  Past Medical History  Diagnosis Date  . RLS (restless legs syndrome)   . Tobacco use     History   Social History  . Marital Status: Single    Spouse Name: N/A  . Number of Children: N/A  . Years of Education: N/A   Occupational History  . Not on file.   Social History Main Topics  . Smoking status: Current Every Day Smoker -- 1.00 packs/day for 35 years    Types: Cigarettes  . Smokeless tobacco: Never Used     Comment: cut back from 1 pack to 1/2 pack day - 35x years  . Alcohol Use: 0.0 oz/week    0 Standard drinks or equivalent per week     Comment: twice a month  . Drug Use: No  . Sexual Activity: Not on file   Other Topics Concern  . 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   Highest level of education:  High school   He lives alone.  He has one son.     Past Surgical History  Procedure Laterality Date  . Cholecystectomy    . Mandible fracture surgery  1984    MVI  . Arm surgery  1984    MVI  . Hip surgery  1984    MVI  . Kidney surgery  1984    MVI  . Cardiac surgery  1984    MVI    Family History  Problem Relation Age of Onset  . Hearing loss Mother     Living  . Leukemia Father 82    Due to agent orange, deceased  . Diabetes Brother   . Obesity Brother   . Healthy Brother   . Alcohol abuse Sister     Allergies  Allergen Reactions  . Banana Nausea And Vomiting    Current Outpatient Prescriptions on File Prior to  Visit  Medication Sig Dispense Refill  . carbidopa-levodopa (SINEMET IR) 25-100 MG per tablet Take 1 tablet by mouth at bedtime as needed (Restless leg syndrome). 90 tablet 6  . naproxen (NAPROSYN) 500 MG tablet TAKE 1 TABLET ONCE OR TWICE A DAY AS NEEDED 180 tablet 1  . rOPINIRole (REQUIP) 4 MG tablet TAKE 1 TABLET BY MOUTH AT BEDTIME 90 tablet 1  . clonazePAM (KLONOPIN) 0.5 MG tablet Take 1 tablet (0.5 mg total) by mouth 2 (two) times daily as needed for anxiety. 60 tablet 5   No current facility-administered medications on file prior to visit.    BP 130/90 mmHg  Pulse 79  Temp(Src) 97.8 F (36.6 C) (Oral)  Resp 20  Ht 5\' 9"  (1.753 m)  Wt 310 lb (140.615 kg)  BMI 45.76 kg/m2  SpO2 97%     Review of Systems  Constitutional: Negative for fever, chills, appetite change and fatigue.  HENT: Negative for congestion, dental problem, ear pain, hearing loss, sore throat, tinnitus, trouble  swallowing and voice change.   Eyes: Negative for pain, discharge and visual disturbance.  Respiratory: Negative for cough, chest tightness, wheezing and stridor.   Cardiovascular: Negative for chest pain, palpitations and leg swelling.  Gastrointestinal: Negative for nausea, vomiting, abdominal pain, diarrhea, constipation, blood in stool and abdominal distention.  Genitourinary: Negative for urgency, hematuria, flank pain, discharge, difficulty urinating and genital sores.  Musculoskeletal: Negative for myalgias, back pain, joint swelling, arthralgias, gait problem and neck stiffness.  Skin: Negative for rash.  Neurological: Negative for dizziness, syncope, speech difficulty, weakness, numbness and headaches.  Hematological: Negative for adenopathy. Does not bruise/bleed easily.  Psychiatric/Behavioral: Negative for behavioral problems and dysphoric mood. The patient is not nervous/anxious.        Objective:   Physical Exam  Constitutional: He is oriented to person, place, and time. He appears  well-developed.  Weight 310 Blood pressure 130/90  HENT:  Head: Normocephalic.  Right Ear: External ear normal.  Left Ear: External ear normal.  Eyes: Conjunctivae and EOM are normal.  Neck: Normal range of motion.  Cardiovascular: Normal rate and normal heart sounds.   Pulmonary/Chest: Breath sounds normal.  Abdominal: Bowel sounds are normal.  Musculoskeletal: Normal range of motion. He exhibits no edema or tenderness.  Neurological: He is alert and oriented to person, place, and time.  Psychiatric: He has a normal mood and affect. His behavior is normal.          Assessment & Plan:   Hypertension, history of.  Blood pressure stable off medication Obesity Impaired glucose tolerance  We'll check updated lab Recheck 6 months or as needed

## 2014-06-18 NOTE — Progress Notes (Signed)
Pre visit review using our clinic review tool, if applicable. No additional management support is needed unless otherwise documented below in the visit note. 

## 2014-08-13 ENCOUNTER — Ambulatory Visit: Payer: BLUE CROSS/BLUE SHIELD | Admitting: Skilled Nursing Facility1

## 2014-08-20 ENCOUNTER — Encounter: Payer: BLUE CROSS/BLUE SHIELD | Attending: Internal Medicine | Admitting: Skilled Nursing Facility1

## 2014-08-20 VITALS — Wt 307.0 lb

## 2014-08-20 DIAGNOSIS — E669 Obesity, unspecified: Secondary | ICD-10-CM | POA: Diagnosis not present

## 2014-08-20 DIAGNOSIS — Z713 Dietary counseling and surveillance: Secondary | ICD-10-CM | POA: Insufficient documentation

## 2014-08-20 DIAGNOSIS — Z6841 Body Mass Index (BMI) 40.0 and over, adult: Secondary | ICD-10-CM | POA: Insufficient documentation

## 2014-08-20 NOTE — Progress Notes (Signed)
  Medical Nutrition Therapy:  Appt start time: 0930 end time:  1000.   Assessment:  Primary concerns today: Follow up for hypertension. Pt states he Worked 15.5 hours last night so he is very tired. Pt states he Joined a gym. Pt states his goal is to lose 100 pounds in one year. Pt states he is deflated because he has only lost 3 pounds. Pt wants to get a read from the tanita scale: 40.9% fat mass and 125 pounds. FFM is 180.5 pounds. Pt states he want to go to the gym more often but with all the hours he works it is close to impossible.  Preferred Learning Style:   Auditory  Learning Readiness:   Change in progress   MEDICATIONS: See List   DIETARY INTAKE:  Usual eating pattern includes 3 meals and 2-3 snacks per day.  Everyday foods include none stated.  Avoided foods include non stated.    24-hr recall:  B ( AM): 2 sandwhich or 2 bowls of cereal with 2% milk or soup Snk ( AM): muffin L ( PM): salad and mixed vegetable Snk ( PM): watermelon and pepper and blackberries D ( PM): chicken and cold pasta Snk ( PM): none Beverages: water  Usual physical activity: 4 days a week at 30 minutes  Estimated energy needs: 2000 calories 225 g carbohydrates 150 g protein 56 g fat  Progress Towards Goal(s):  In progress.   Nutritional Diagnosis:  [place]-3.3 Overweight/obesity As related to increased caloric intake.  As evidenced by pt report.    Intervention:  Nutrition counseling for hypertension/obesity. Dietitian discussed balanced meals, physical activity, and celebrating small victories.  Teaching Method Utilized:  Visual  Barriers to learning/adherence to lifestyle change: heavy work schedule  Demonstrated degree of understanding via:  Teach Back   Monitoring/Evaluation:  Dietary intake, exercise, and body weight in 3 month(s).

## 2014-09-07 ENCOUNTER — Telehealth: Payer: Self-pay | Admitting: Internal Medicine

## 2014-09-07 NOTE — Telephone Encounter (Signed)
Pt would like to know if dr Raliegh Ip wil increase his carbidopa-levodopa (SINEMET IR) 25-100 MG per tablet? Pt's RLS seems to be getting worse, as pt is working 3 shift , driving a truck. Pt can only come in in Friday b/c that is his day off. Pt is having numbness and pain in his feet and will see dr Maudie Mercury on Friday b/c dr Raliegh Ip doesn't have an appt Do you want to work in him > you have 2 same days left  Please advisf  [facility]/ [address]

## 2014-09-09 NOTE — Telephone Encounter (Signed)
Patrick [name], you can work pt in tomorrow for Dr.K.

## 2014-09-10 ENCOUNTER — Encounter: Payer: Self-pay | Admitting: Internal Medicine

## 2014-09-10 ENCOUNTER — Ambulatory Visit (INDEPENDENT_AMBULATORY_CARE_PROVIDER_SITE_OTHER): Payer: BLUE CROSS/BLUE SHIELD | Admitting: Internal Medicine

## 2014-09-10 ENCOUNTER — Ambulatory Visit: Payer: BLUE CROSS/BLUE SHIELD | Admitting: Family Medicine

## 2014-09-10 VITALS — BP 120/78 | HR 92 | Temp 98.1°F | Resp 20 | Ht 69.0 in | Wt 308.0 lb

## 2014-09-10 DIAGNOSIS — I10 Essential (primary) hypertension: Secondary | ICD-10-CM | POA: Diagnosis not present

## 2014-09-10 DIAGNOSIS — R7302 Impaired glucose tolerance (oral): Secondary | ICD-10-CM | POA: Diagnosis not present

## 2014-09-10 DIAGNOSIS — M25579 Pain in unspecified ankle and joints of unspecified foot: Secondary | ICD-10-CM | POA: Diagnosis not present

## 2014-09-10 DIAGNOSIS — E669 Obesity, unspecified: Secondary | ICD-10-CM | POA: Diagnosis not present

## 2014-09-10 MED ORDER — CARBIDOPA-LEVODOPA 25-250 MG PO TABS: ORAL_TABLET | ORAL | Status: DC

## 2014-09-10 MED ORDER — NAPROXEN 500 MG PO TABS: ORAL_TABLET | ORAL | Status: DC

## 2014-09-10 MED ORDER — CLONAZEPAM 0.5 MG PO TABS: 0.5000 mg | ORAL_TABLET | Freq: Two times a day (BID) | ORAL | Status: DC | PRN

## 2014-09-10 NOTE — Progress Notes (Signed)
Pre visit review using our clinic review tool, if applicable. No additional management support is needed unless otherwise documented below in the visit note. 

## 2014-09-10 NOTE — Patient Instructions (Signed)
Call or return to clinic prn if these symptoms worsen or fail to improve as anticipated.

## 2014-09-10 NOTE — Progress Notes (Signed)
Subjective:    Patient ID: Patrick [name], male    DOB: [birth year], 49 y.o.   MRN: [id number]  HPI  49 year old patient who has a history of restless leg syndrome.  For the past 6 weeks he has had intermittent bilateral foot symptoms.  He describes toes involving his right foot as having a intermittent tingling sensation.  He also describes both a painful dysesthesia as well as numbness involving the plantar surface of his left forefoot.  Symptoms seem to the wax and wane He does have a history of impaired glucose tolerance  Past Medical History  Diagnosis Date  . RLS (restless legs syndrome)   . Tobacco use     History   Social History  . Marital Status: Single    Spouse Name: N/A  . Number of Children: N/A  . Years of Education: N/A   Occupational History  . Not on file.   Social History Main Topics  . Smoking status: Current Every Day Smoker -- 1.00 packs/day for 35 years    Types: Cigarettes  . Smokeless tobacco: Never Used     Comment: cut back from 1 pack to 1/2 pack day - 35x years  . Alcohol Use: 0.0 oz/week    0 Standard drinks or equivalent per week     Comment: twice a month  . Drug Use: No  . Sexual Activity: Not on file   Other Topics Concern  . 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   Highest level of education:  High school   He lives alone.  He has one son.     Past Surgical History  Procedure Laterality Date  . Cholecystectomy    . Mandible fracture surgery  1984    MVI  . Arm surgery  1984    MVI  . Hip surgery  1984    MVI  . Kidney surgery  1984    MVI  . Cardiac surgery  1984    MVI    Family History  Problem Relation Age of Onset  . Hearing loss Mother     Living  . Leukemia Father 86    Due to agent orange, deceased  . Diabetes Brother   . Obesity Brother   . Healthy Brother   . Alcohol abuse Sister     Allergies  Allergen Reactions  . Banana Nausea  And Vomiting    Current Outpatient Prescriptions on File Prior to Visit  Medication Sig Dispense Refill  . ibuprofen (ADVIL,MOTRIN) 200 MG tablet Take 600 mg by mouth every 6 (six) hours as needed.    Marland Kitchen rOPINIRole (REQUIP) 4 MG tablet TAKE 1 TABLET BY MOUTH AT BEDTIME 90 tablet 1   No current facility-administered medications on file prior to visit.    BP 120/78 mmHg  Pulse 92  Temp(Src) 98.1 F (36.7 C) (Oral)  Resp 20  Ht 5\' 9"  (1.753 m)  Wt 308 lb (139.708 kg)  BMI 45.46 kg/m2  SpO2 96%     Review of Systems  Constitutional: Negative for fever, chills, appetite change and fatigue.  HENT: Negative for congestion, dental problem, ear pain, hearing loss, sore throat, tinnitus, trouble swallowing and voice change.   Eyes: Negative for pain, discharge and visual disturbance.  Respiratory: Negative for cough, chest tightness, wheezing and stridor.   Cardiovascular: Negative for chest pain, palpitations and leg swelling.  Gastrointestinal: Negative  for nausea, vomiting, abdominal pain, diarrhea, constipation, blood in stool and abdominal distention.  Genitourinary: Negative for urgency, hematuria, flank pain, discharge, difficulty urinating and genital sores.  Musculoskeletal: Negative for myalgias, back pain, joint swelling, arthralgias, gait problem and neck stiffness.  Skin: Negative for rash.  Neurological: Positive for numbness. Negative for dizziness, syncope, speech difficulty, weakness and headaches.  Hematological: Negative for adenopathy. Does not bruise/bleed easily.  Psychiatric/Behavioral: Negative for behavioral problems and dysphoric mood. The patient is not nervous/anxious.        Objective:   Physical Exam  Constitutional: He is oriented to person, place, and time. He appears well-developed.  HENT:  Head: Normocephalic.  Right Ear: External ear normal.  Left Ear: External ear normal.  Eyes: Conjunctivae and EOM are normal.  Neck: Normal range of motion.    Cardiovascular: Normal rate and normal heart sounds.   Pulmonary/Chest: Breath sounds normal.  Abdominal: Bowel sounds are normal.  Musculoskeletal: Normal range of motion. He exhibits no edema or tenderness.  Neurological: He is alert and oriented to person, place, and time.  Laboratory sensation, proprioception and monofilament testing normal involving both feet  Psychiatric: He has a normal mood and affect. His behavior is normal.          Assessment & Plan:   Nonspecific foot pain.  Cannot rule out early neuropathy related to his impaired glucose tolerance, but most of the symptoms sound mechanical.   weight loss, proper foot care.  All discussed.  He'll report any new or worsening symptoms RLS.  Medications refilled  Hypertension, well-controlled Obesity.  Weight loss encouraged  CPX 6 months

## 2014-09-16 ENCOUNTER — Other Ambulatory Visit: Payer: Self-pay | Admitting: Internal Medicine

## 2014-09-18 ENCOUNTER — Other Ambulatory Visit: Payer: Self-pay | Admitting: Internal Medicine

## 2014-11-26 ENCOUNTER — Ambulatory Visit: Payer: BLUE CROSS/BLUE SHIELD | Admitting: Skilled Nursing Facility1

## 2014-12-13 ENCOUNTER — Other Ambulatory Visit: Payer: Self-pay | Admitting: Internal Medicine

## 2014-12-24 ENCOUNTER — Ambulatory Visit: Payer: BLUE CROSS/BLUE SHIELD | Admitting: Internal Medicine

## 2015-07-28 ENCOUNTER — Other Ambulatory Visit: Payer: Self-pay | Admitting: Internal Medicine

## 2015-11-27 ENCOUNTER — Other Ambulatory Visit: Payer: Self-pay | Admitting: Internal Medicine

## 2015-11-29 NOTE — Telephone Encounter (Signed)
Rx refill sent to pharmacy. 

## 2015-12-01 ENCOUNTER — Other Ambulatory Visit: Payer: Self-pay | Admitting: Internal Medicine

## 2015-12-01 NOTE — Telephone Encounter (Signed)
Rx refill sent to pharmacy. 

## 2015-12-12 ENCOUNTER — Other Ambulatory Visit: Payer: Self-pay | Admitting: Internal Medicine

## 2015-12-28 ENCOUNTER — Other Ambulatory Visit: Payer: Self-pay | Admitting: Internal Medicine

## 2015-12-29 ENCOUNTER — Encounter: Payer: Self-pay | Admitting: Internal Medicine

## 2015-12-29 ENCOUNTER — Ambulatory Visit (INDEPENDENT_AMBULATORY_CARE_PROVIDER_SITE_OTHER): Payer: Managed Care, Other (non HMO) | Admitting: Internal Medicine

## 2015-12-29 VITALS — BP 142/90 | HR 83 | Temp 98.1°F | Resp 20 | Ht 69.0 in | Wt 252.5 lb

## 2015-12-29 DIAGNOSIS — M25552 Pain in left hip: Secondary | ICD-10-CM | POA: Diagnosis not present

## 2015-12-29 DIAGNOSIS — I10 Essential (primary) hypertension: Secondary | ICD-10-CM

## 2015-12-29 MED ORDER — MELOXICAM 15 MG PO TABS: 15.0000 mg | ORAL_TABLET | Freq: Every day | ORAL | 0 refills | Status: DC

## 2015-12-29 MED ORDER — TRAMADOL HCL 50 MG PO TABS: 50.0000 mg | ORAL_TABLET | Freq: Four times a day (QID) | ORAL | 0 refills | Status: DC | PRN
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
## 2015-12-29 NOTE — Patient Instructions (Signed)
Orthopedic consultation as discussed  Schedule annual exam  Limit your sodium (Salt) intake  Please check your blood pressure on a regular basis.  If it is consistently greater than 150/90, please make an office appointment.

## 2016-01-19 ENCOUNTER — Telehealth: Payer: Self-pay | Admitting: Internal Medicine

## 2016-01-19 NOTE — Telephone Encounter (Signed)
Pt said ropinirole and carbidopa-levodopa is not strong enough. Pt would like increase in mg. [facility] [place]. Pt is aware md out of office

## 2016-01-23 NOTE — Telephone Encounter (Signed)
Would not recommend exceeding present dosing for treatment of restless leg syndrome. Ask patient to consider neurology referral since symptoms are refractory to present aggressive regimen

## 2016-01-23 NOTE — Telephone Encounter (Signed)
Please see message and advise 

## 2016-01-24 NOTE — Telephone Encounter (Signed)
Left message on voicemail to call office.  

## 2016-01-26 NOTE — Telephone Encounter (Signed)
Pt came in the office today and was told needs to make an appt to see Dr.K.

## 2016-02-13 ENCOUNTER — Telehealth: Payer: Self-pay | Admitting: Internal Medicine

## 2016-02-13 MED ORDER — ROPINIROLE HCL 4 MG PO TABS: 4.0000 mg | ORAL_TABLET | Freq: Every day | ORAL | 0 refills | Status: DC

## 2016-02-13 MED ORDER — CARBIDOPA-LEVODOPA 25-100 MG PO TABS: 1.0000 | ORAL_TABLET | Freq: Every day | ORAL | 0 refills | Status: DC

## 2016-02-13 MED ORDER — TRAMADOL HCL 50 MG PO TABS: 50.0000 mg | ORAL_TABLET | Freq: Four times a day (QID) | ORAL | 0 refills | Status: DC | PRN

## 2016-02-13 NOTE — Telephone Encounter (Signed)
Pt has new pharm [facility] [place] . Pt needs 90 day supply w/refills tramadol,ropinole, carbidopa-levodopa 25-100 mg

## 2016-02-13 NOTE — Telephone Encounter (Signed)
Left message on voicemail to call office. Pt was suppose to make an appt to discuss medications Carbidopa-Levodopa and Ropinirole because they were not working.

## 2016-02-13 NOTE — Telephone Encounter (Signed)
Pt called back, asked him if he is still taking the medications he requested to be filled because I had they were not working. Pt said yes he is and he has to take something or he would not sleep at all has an aupcoming appt. Told him okay will refill till appt and can discuss at visit. Pt verbalized understanding. Rx's sent to pharmacy and called in.

## 2016-02-26 ENCOUNTER — Other Ambulatory Visit: Payer: Self-pay | Admitting: Adult Health

## 2016-02-28 NOTE — Telephone Encounter (Signed)
Dr K patient

## 2016-03-14 ENCOUNTER — Other Ambulatory Visit (INDEPENDENT_AMBULATORY_CARE_PROVIDER_SITE_OTHER): Payer: Managed Care, Other (non HMO)

## 2016-03-14 DIAGNOSIS — Z0000 Encounter for general adult medical examination without abnormal findings: Secondary | ICD-10-CM | POA: Diagnosis not present

## 2016-03-14 LAB — CBC WITH DIFFERENTIAL/PLATELET
BASOS PCT: 0.5 % (ref 0.0–3.0)
Basophils Absolute: 0 10*3/uL (ref 0.0–0.1)
EOS PCT: 2.9 % (ref 0.0–5.0)
Eosinophils Absolute: 0.2 10*3/uL (ref 0.0–0.7)
HEMATOCRIT: 42.6 % (ref 39.0–52.0)
HEMOGLOBIN: 14.3 g/dL (ref 13.0–17.0)
LYMPHS PCT: 24.7 % (ref 12.0–46.0)
Lymphs Abs: 2 10*3/uL (ref 0.7–4.0)
MCHC: 33.7 g/dL (ref 30.0–36.0)
MCV: 92 fl (ref 78.0–100.0)
MONO ABS: 0.7 10*3/uL (ref 0.1–1.0)
MONOS PCT: 8.9 % (ref 3.0–12.0)
Neutro Abs: 5 10*3/uL (ref 1.4–7.7)
Neutrophils Relative %: 63 % (ref 43.0–77.0)
Platelets: 230 10*3/uL (ref 150.0–400.0)
RBC: 4.63 Mil/uL (ref 4.22–5.81)
RDW: 14.2 % (ref 11.5–15.5)
WBC: 7.9 10*3/uL (ref 4.0–10.5)

## 2016-03-14 LAB — POC URINALSYSI DIPSTICK (AUTOMATED)
Bilirubin, UA: NEGATIVE
Glucose, UA: NEGATIVE
KETONES UA: NEGATIVE
LEUKOCYTES UA: NEGATIVE
Nitrite, UA: NEGATIVE
PH UA: 5.5
SPEC GRAV UA: 1.02
UROBILINOGEN UA: 0.2

## 2016-03-14 LAB — HEPATIC FUNCTION PANEL
ALT: 10 U/L (ref 0–53)
AST: 13 U/L (ref 0–37)
Albumin: 3.8 g/dL (ref 3.5–5.2)
Alkaline Phosphatase: 72 U/L (ref 39–117)
BILIRUBIN TOTAL: 0.7 mg/dL (ref 0.2–1.2)
Bilirubin, Direct: 0.2 mg/dL (ref 0.0–0.3)
TOTAL PROTEIN: 6.6 g/dL (ref 6.0–8.3)

## 2016-03-14 LAB — LIPID PANEL
Cholesterol: 140 mg/dL (ref 0–200)
HDL: 57.7 mg/dL
LDL Cholesterol: 70 mg/dL (ref 0–99)
NonHDL: 82.39
Total CHOL/HDL Ratio: 2
Triglycerides: 63 mg/dL (ref 0.0–149.0)
VLDL: 12.6 mg/dL (ref 0.0–40.0)

## 2016-03-14 LAB — PSA: PSA: 1.75 ng/mL (ref 0.10–4.00)

## 2016-03-14 LAB — BASIC METABOLIC PANEL
BUN: 18 mg/dL (ref 6–23)
CHLORIDE: 104 meq/L (ref 96–112)
CO2: 31 mEq/L (ref 19–32)
Calcium: 9.1 mg/dL (ref 8.4–10.5)
Creatinine, Ser: 0.83 mg/dL (ref 0.40–1.50)
GFR: 103.16 mL/min (ref >60.00)
Glucose, Bld: 109 mg/dL — ABNORMAL HIGH (ref 70–99)
POTASSIUM: 4.8 meq/L (ref 3.5–5.1)
SODIUM: 141 meq/L (ref 135–145)

## 2016-03-14 LAB — TSH: TSH: 0.8 u[IU]/mL (ref 0.35–4.50)

## 2016-03-21 ENCOUNTER — Other Ambulatory Visit: Payer: Self-pay | Admitting: Internal Medicine

## 2016-03-21 ENCOUNTER — Encounter: Payer: Self-pay | Admitting: Internal Medicine

## 2016-03-21 ENCOUNTER — Ambulatory Visit (INDEPENDENT_AMBULATORY_CARE_PROVIDER_SITE_OTHER): Payer: Managed Care, Other (non HMO) | Admitting: Internal Medicine

## 2016-03-21 VITALS — BP 142/78 | HR 73 | Temp 97.6°F | Ht 68.5 in | Wt 245.5 lb

## 2016-03-21 DIAGNOSIS — R7302 Impaired glucose tolerance (oral): Secondary | ICD-10-CM | POA: Diagnosis not present

## 2016-03-21 DIAGNOSIS — Z0000 Encounter for general adult medical examination without abnormal findings: Secondary | ICD-10-CM

## 2016-03-21 DIAGNOSIS — I10 Essential (primary) hypertension: Secondary | ICD-10-CM

## 2016-03-21 MED ORDER — CARBIDOPA-LEVODOPA ER 50-200 MG PO TBCR: 1.0000 | EXTENDED_RELEASE_TABLET | Freq: Two times a day (BID) | ORAL | 3 refills | Status: DC

## 2016-03-21 MED ORDER — CLONAZEPAM 0.5 MG PO TABS: 0.5000 mg | ORAL_TABLET | Freq: Two times a day (BID) | ORAL | 5 refills | Status: DC | PRN

## 2016-03-21 NOTE — Progress Notes (Signed)
Subjective:    Patient ID: Patrick [name], male    DOB: [birth year], 50 y.o.   MRN: [id number]  HPI  50 year old patient who is seen today for a preventive health examination He has a history of RSL which has been refractory to medications.  Due to lack of health insurance.  She has been off multiple medications and has done poorly He has a history of essential hypertension, diet-controlled, as well as a history of impaired glucose tolerance He is followed by orthopedics due to end-stage left hip osteoarthritis.  He continues to have considerable left hip pain  Past Medical History:  Diagnosis Date  . RLS (restless legs syndrome)   . Tobacco use      Social History   Social History  . Marital status: Single    Spouse name: N/A  . Number of children: N/A  . Years of education: N/A   Occupational History  . Not on file.   Social History Main Topics  . Smoking status: Current Every Day Smoker    Packs/day: 1.00    Years: 35.00    Types: Cigarettes  . Smokeless tobacco: Never Used     Comment: cut back from 1 pack to 1/2 pack day - 35x years  . Alcohol use 0.0 oz/week     Comment: twice a month  . Drug use: No  . Sexual activity: Not on file   Other Topics Concern  . 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   Highest level of education:  High school   He lives alone.  He has one son.     Past Surgical History:  Procedure Laterality Date  . arm surgery  1984   MVI  . CARDIAC SURGERY  1984   MVI  . CHOLECYSTECTOMY    . HIP SURGERY  1984   MVI  . KIDNEY SURGERY  1984   MVI  . MANDIBLE FRACTURE SURGERY  1984   MVI    Family History  Problem Relation Age of Onset  . Hearing loss Mother     Living  . Leukemia Father 42    Due to agent orange, deceased  . Diabetes Brother   . Obesity Brother   . Healthy Brother   . Alcohol abuse Sister     Allergies  Allergen Reactions  . Banana  Nausea And Vomiting    Current Outpatient Prescriptions on File Prior to Visit  Medication Sig Dispense Refill  . ibuprofen (ADVIL,MOTRIN) 200 MG tablet Take 600 mg by mouth every 6 (six) hours as needed.    . meloxicam (MOBIC) 15 MG tablet Take 1 tablet (15 mg total) by mouth daily. 60 tablet 0  . rOPINIRole (REQUIP) 4 MG tablet Take 1 tablet (4 mg total) by mouth at bedtime. 90 tablet 0  . traMADol (ULTRAM) 50 MG tablet Take 1 tablet (50 mg total) by mouth every 6 (six) hours as needed. 60 tablet 0   No current facility-administered medications on file prior to visit.     BP (!) 142/78 (BP Location: Left Arm, Patient Position: Sitting, Cuff Size: Normal)   Pulse 73   Temp 97.6 F (36.4 C) (Oral)   Ht 5' 8.5" (1.74 m)   Wt 245 lb 8 oz (111.4 kg)   SpO2 97%   BMI 36.79 kg/m     Review of Systems  Constitutional: Negative for activity change,  appetite change, chills, fatigue and fever.  HENT: Negative for congestion, dental problem, ear pain, hearing loss, mouth sores, rhinorrhea, sinus pressure, sneezing, tinnitus, trouble swallowing and voice change.   Eyes: Negative for photophobia, pain, redness and visual disturbance.  Respiratory: Negative for apnea, cough, choking, chest tightness, shortness of breath and wheezing.   Cardiovascular: Negative for chest pain, palpitations and leg swelling.  Gastrointestinal: Negative for abdominal distention, abdominal pain, anal bleeding, blood in stool, constipation, diarrhea, nausea, rectal pain and vomiting.  Genitourinary: Negative for decreased urine volume, difficulty urinating, discharge, dysuria, flank pain, frequency, genital sores, hematuria, penile swelling, scrotal swelling, testicular pain and urgency.  Musculoskeletal: Positive for arthralgias and gait problem. Negative for back pain, joint swelling, myalgias, neck pain and neck stiffness.  Skin: Negative for color change, rash and wound.  Neurological: Negative for dizziness,  tremors, seizures, syncope, facial asymmetry, speech difficulty, weakness, light-headedness, numbness and headaches.  Hematological: Negative for adenopathy. Does not bruise/bleed easily.  Psychiatric/Behavioral: Negative for agitation, behavioral problems, confusion, decreased concentration, dysphoric mood, hallucinations, self-injury, sleep disturbance and suicidal ideas. The patient is not nervous/anxious.        Objective:   Physical Exam  Constitutional: He appears well-developed and well-nourished.  HENT:  Head: Normocephalic and atraumatic.  Right Ear: External ear normal.  Left Ear: External ear normal.  Nose: Nose normal.  Mouth/Throat: Oropharynx is clear and moist.  Eyes: Conjunctivae and EOM are normal. Pupils are equal, round, and reactive to light. No scleral icterus.  Neck: Normal range of motion. Neck supple. No JVD present. No thyromegaly present.  Cardiovascular: Regular rhythm, normal heart sounds and intact distal pulses.  Exam reveals no gallop and no friction rub.   No murmur heard. Pulmonary/Chest: Effort normal and breath sounds normal. He exhibits no tenderness.  Abdominal: Soft. Bowel sounds are normal. He exhibits no distension and no mass. There is no tenderness.  Genitourinary: Prostate normal and penis normal. Rectal exam shows guaiac negative stool.  Musculoskeletal: Normal range of motion. He exhibits no edema or tenderness.  Lymphadenopathy:    He has no cervical adenopathy.  Neurological: He is alert. He has normal reflexes. No cranial nerve deficit. Coordination normal.  Skin: Skin is warm and dry. No rash noted.  Psychiatric: He has a normal mood and affect. His behavior is normal.          Assessment & Plan:   Preventive health examination Essential hypertension.  Will continue efforts at weight loss and restricted salt diet Home blood pressure monitoring recommended.  Continue to observe off medication.  Blood pressure in a high normal range  today of 140 over 80 Restless leg syndrome.  Medications updated Osteoarthritis left hip.  Follow-up orthopedics  Will schedule screening colonoscopy Follow-up 6-12 months  Patrick [name] Pilar Plate

## 2016-03-21 NOTE — Patient Instructions (Signed)
Limit your sodium (Salt) intake    It is important that you exercise regularly, at least 20 minutes 3 to 4 times per week.  If you develop chest pain or shortness of breath seek  medical attention.  You need to lose weight.  Consider a lower calorie diet and regular exercise.  Schedule your colonoscopy to help detect colon cancer.  Please check your blood pressure on a regular basis.  If it is consistently greater than 150/90, please make an office appointment.  Return in one year for follow-up

## 2016-03-21 NOTE — Progress Notes (Signed)
Pre visit review using our clinic review tool, if applicable. No additional management support is needed unless otherwise documented below in the visit note. 

## 2016-03-22 ENCOUNTER — Other Ambulatory Visit: Payer: Self-pay | Admitting: Internal Medicine

## 2016-03-22 ENCOUNTER — Encounter: Payer: Self-pay | Admitting: Internal Medicine

## 2016-03-22 ENCOUNTER — Telehealth: Payer: Self-pay | Admitting: Internal Medicine

## 2016-03-22 NOTE — Telephone Encounter (Signed)
° °  Pt request refill of the following:  meloxicam (MOBIC) 15 MG tablet    Phamacy:

## 2016-03-23 ENCOUNTER — Other Ambulatory Visit: Payer: Self-pay | Admitting: Internal Medicine

## 2016-03-23 MED ORDER — MELOXICAM 15 MG PO TABS: 15.0000 mg | ORAL_TABLET | Freq: Every day | ORAL | 0 refills | Status: DC

## 2016-03-23 NOTE — Telephone Encounter (Signed)
Rx refilled and sent to pharmacy.

## 2016-03-28 ENCOUNTER — Other Ambulatory Visit: Payer: Self-pay | Admitting: Internal Medicine

## 2016-03-28 MED ORDER — ROPINIROLE HCL 4 MG PO TABS: 4.0000 mg | ORAL_TABLET | Freq: Every day | ORAL | 1 refills | Status: DC

## 2016-04-04 ENCOUNTER — Other Ambulatory Visit: Payer: Self-pay | Admitting: Internal Medicine

## 2016-04-04 MED ORDER — TRAMADOL HCL 50 MG PO TABS: 50.0000 mg | ORAL_TABLET | Freq: Four times a day (QID) | ORAL | 2 refills | Status: DC | PRN

## 2016-04-11 ENCOUNTER — Other Ambulatory Visit: Payer: Self-pay | Admitting: Internal Medicine

## 2016-04-12 ENCOUNTER — Ambulatory Visit (INDEPENDENT_AMBULATORY_CARE_PROVIDER_SITE_OTHER): Payer: Managed Care, Other (non HMO) | Admitting: Neurology

## 2016-04-12 ENCOUNTER — Encounter: Payer: Self-pay | Admitting: Neurology

## 2016-04-12 VITALS — BP 150/90 | HR 84 | Ht 68.5 in | Wt 247.1 lb

## 2016-04-12 DIAGNOSIS — G2581 Restless legs syndrome: Secondary | ICD-10-CM | POA: Diagnosis not present

## 2016-04-12 MED ORDER — ROPINIROLE HCL 4 MG PO TABS: ORAL_TABLET | ORAL | 3 refills | Status: DC

## 2016-04-12 MED ORDER — CARBIDOPA-LEVODOPA 25-100 MG PO TABS: 1.0000 | ORAL_TABLET | Freq: Every day | ORAL | 3 refills | Status: DC

## 2016-04-12 NOTE — Progress Notes (Signed)
Follow-up Visit   Date: 04/12/16    Patrick [name] MRN: [id number] DOB: 23-Mar-1966   Interim History: Patrick [name] is a 51 y.o. right-handed Caucasian male with restless leg syndrome and tobacco abuse returning to the clinic for follow-up of RLS.  The patient was accompanied to the clinic by self.  History of present illness: He reports having a long history of bilateral upper thigh discomfort for at least the past 20 years.  Pain is described as "crawling sensation, tiny cramps".  Pain is localized at the lateral thighs bilaterally. Walking and squatting seems to alleviate discomfort.  He has noticed that chocolate and stress exacerbates symptoms.  He has not noticed a significant worsening of symptoms at rest, but pain is more bothersome at night.  He has been on requip for at least the past 10 years and has been on requip 4mg  which makes him very sleepy.  He was started on sinemet 25/100  1 tablet daily.  He was previously taking 1 tablet as needed per night.  Over the last 6-8 months, he was stressed due to personal relationship issues and started taking 3-4 tablets per night.  Since moving out and living separate last week, his leg discomfort has improved and he has only needed to take it twice over the past week.  He attributes the severe worsening of discomfort to his level of stress now that he is doing much better.   UPDATE 04/12/2016:   Starting around the summer of 2017, he began noticing worsening restless sensation of the legs.  Now, he has restlessness within 10-minutes of driving.  Because symptoms were so severe, he has needed to take sinemet 1.5 tab 1 hour prior to leaving work, just so that he can drive home.  The sinemet used to provide relief for 4 hours, now he only has 1.5 hours of relief. Previously, he was able to sleep through the night on Requip 4mg  at bedtime, but now he gets about 4 hours of sleep. He is extremely frustrated and aggravated by his discomfort.   He  has not been on any other medication for RLS.   Medications:  Current Outpatient Prescriptions on File Prior to Visit  Medication Sig Dispense Refill  . carbidopa-levodopa (SINEMET CR) 50-200 MG tablet Take 1 tablet by mouth 2 (two) times daily. 60 tablet 3  . carbidopa-levodopa (SINEMET IR) 25-100 MG tablet TAKE ONE TABLET BY MOUTH AT BEDTIME 90 tablet 0  . clonazePAM (KLONOPIN) 0.5 MG tablet Take 1 tablet (0.5 mg total) by mouth 2 (two) times daily as needed for anxiety. 60 tablet 5  . ibuprofen (ADVIL,MOTRIN) 200 MG tablet Take 600 mg by mouth every 6 (six) hours as needed.    . meloxicam (MOBIC) 15 MG tablet Take 1 tablet (15 mg total) by mouth daily. 60 tablet 0  . methocarbamol (ROBAXIN) 500 MG tablet     . rOPINIRole (REQUIP) 4 MG tablet Take 1 tablet (4 mg total) by mouth at bedtime. 90 tablet 1  . traMADol (ULTRAM) 50 MG tablet TAKE ONE TABLET BY MOUTH EVERY 6 HOURS AS NEEDED FOR PAIN 60 tablet 0   No current facility-administered medications on file prior to visit.     Allergies:  Allergies  Allergen Reactions  . Banana Nausea And Vomiting    Review of Systems:  CONSTITUTIONAL: No fevers, chills, night sweats, or weight loss.  EYES: No visual changes or eye pain ENT: No hearing changes.  No history of nose  bleeds.   RESPIRATORY: No cough, wheezing and shortness of breath.   CARDIOVASCULAR: Negative for chest pain, and palpitations.   GI: Negative for abdominal discomfort, blood in stools or black stools.  No recent change in bowel habits.   GU:  No history of incontinence.   MUSCLOSKELETAL: +history of joint pain or swelling.  No myalgias.   SKIN: Negative for lesions, rash, and itching.   ENDOCRINE: Negative for cold or heat intolerance, polydipsia or goiter.   PSYCH:  No depression or anxiety symptoms.   NEURO: As Above.   Vital Signs:  BP (!) 150/90   Pulse 84   Ht 5' 8.5" (1.74 m)   Wt 247 lb 2 oz (112.1 kg)   SpO2 98%   BMI 37.03 kg/m   Neurological  Exam: MENTAL STATUS including orientation to time, place, person, recent and remote memory, attention span and concentration, language, and fund of knowledge is normal.  Speech is not dysarthric.  CRANIAL NERVES: No visual field defects.  Pupils equal round and reactive to light.  Normal conjugate, extra-ocular eye movements in all directions of gaze.  No ptosis. Normal facial sensation.  Face is symmetric. Palate elevates symmetrically.  Tongue is midline.  MOTOR:  Motor strength is 5/5 in all extremities, except LLE is limited in strength testing due to left hip pain.  No atrophy, fasciculations or abnormal movements.  No pronator drift.  Tone is normal.    MSRs:  Reflexes are 2+/4 throughout.  SENSORY:  Intact to vibration.  COORDINATION/GAIT:  Gait is antalgic due to left hip pain.    Data: Lab Results  Component Value Date   FERRITIN 136 04/23/2014    IMPRESSION/PLAN: Restless leg syndrome - worsening  - Need to determine whether this is truly worsening disease vs. augmentation of dopamine agonist.  - Because he has not been on a higher dose of ropinorole, will first try to see if there is any benefit with increasing in Requip to 2mg  to 5pm and continue requip 4mg  at bedtime  - Continue sinemet 25/100 1-1.5 tablet daily as needed  - If there is paradoxical worsening of RLS with increasing ropinorole, symptoms are most likely due to augmentation and he will need to be tapered off this and started on an alternative medication, such as gabapentin or Lyrica.  Return to clinic in 4 monts   The duration of this appointment visit was 30 minutes of face-to-face time with the patient.  Greater than 50% of this time was spent in counseling, explanation of diagnosis, planning of further management, and coordination of care.   Thank you for allowing me to participate in patient's care.  If I can answer any additional questions, I would be pleased to do so.    Sincerely,    Chamia Schmutz K.  Posey Pronto, DO

## 2016-04-12 NOTE — Patient Instructions (Addendum)
1.  Starting taking Requip 2mg  at 5pm and continue your bedtime dose of 4mg  daily 2.  You may continue to take sinemet for breakthrough symptoms  Return to clinic in 4 months

## 2016-04-27 ENCOUNTER — Telehealth: Payer: Self-pay | Admitting: Neurology

## 2016-04-27 NOTE — Telephone Encounter (Signed)
Called patient and advised Rx for ropinirole 4 mg sent on 04/12/16. Patient says he called the pharmacy and they told him nothing on file.  Confirmed correct pharmacy on file. Called pharmacy confirmed med waiting to be picked up. Called patient back and advised.

## 2016-04-27 NOTE — Telephone Encounter (Signed)
Patrick [name] 12-08-2065. His # [id number]. He uses [facility] in [place]. He is needing a refill on Ropinirole (Requip). He said his pharmacy does not have anything in their system. He is down to 1 pill for his RLS.He said his Family Doctor is not in today. Thank you

## 2016-05-09 ENCOUNTER — Other Ambulatory Visit: Payer: Self-pay | Admitting: Internal Medicine

## 2016-05-14 ENCOUNTER — Other Ambulatory Visit: Payer: Self-pay | Admitting: Internal Medicine

## 2016-05-14 MED ORDER — MELOXICAM 15 MG PO TABS: 15.0000 mg | ORAL_TABLET | Freq: Every day | ORAL | 1 refills | Status: DC

## 2016-05-14 MED ORDER — TRAMADOL HCL 50 MG PO TABS: 50.0000 mg | ORAL_TABLET | Freq: Four times a day (QID) | ORAL | 0 refills | Status: DC | PRN

## 2016-05-15 ENCOUNTER — Telehealth: Payer: Self-pay | Admitting: Internal Medicine

## 2016-05-15 NOTE — Telephone Encounter (Signed)
See message below - please advise

## 2016-05-15 NOTE — Telephone Encounter (Signed)
Generic LOMOTIL No. 30   2.  Initially, then 1 every 4 hours as needed for diarrhea  Take [phone] mg of Tylenol every 6 hours as needed for pain relief or fever.  Avoid taking more than 3000 mg in a 24-hour period (  This may cause liver damage).   Take over-the-counter expectorants and cough medications such as  Mucinex DM.  Call if there is no improvement in 5 to 7 days or if  you develop worsening cough, fever, or new symptoms, such as shortness of breath or chest pain.  Drink as much fluid as you  can tolerate over the next few days

## 2016-05-15 NOTE — Telephone Encounter (Signed)
Pt calling in to see if Dr. Raliegh Ip would send him something in for the following symptoms diarrhea,fever,chills,cough and other flu like symptoms.  Pt state that he can not get far from the bathroom due to having diarrhea.  Pt refused appointment for today at 2:30 p.m.

## 2016-05-16 ENCOUNTER — Encounter: Payer: Managed Care, Other (non HMO) | Admitting: Internal Medicine

## 2016-05-16 ENCOUNTER — Other Ambulatory Visit: Payer: Self-pay | Admitting: Internal Medicine

## 2016-05-16 MED ORDER — DIPHENOXYLATE-ATROPINE 2.5-0.025 MG PO TABS: ORAL_TABLET | ORAL | 0 refills | Status: DC

## 2016-05-16 NOTE — Telephone Encounter (Signed)
Completed.

## 2016-06-04 ENCOUNTER — Encounter: Payer: Self-pay | Admitting: Internal Medicine

## 2016-06-07 ENCOUNTER — Telehealth: Payer: Self-pay | Admitting: Internal Medicine

## 2016-06-07 NOTE — Telephone Encounter (Signed)
See below, please advise.

## 2016-06-07 NOTE — Telephone Encounter (Signed)
Pt would like to see if Dr. Raliegh Ip had received the form for his surgical clearance pt is scheduled to have surgery in April.

## 2016-06-07 NOTE — Telephone Encounter (Signed)
I believe this form has been completed and patient cleared for surgery in April

## 2016-06-08 NOTE — Telephone Encounter (Signed)
Surgery clearance paperwork was faxed to [facility] at [facility]aliegh Ip [facility] on 06/08/16 @ [phone]

## 2016-06-14 ENCOUNTER — Ambulatory Visit (AMBULATORY_SURGERY_CENTER): Payer: Self-pay

## 2016-06-14 VITALS — Ht 68.0 in | Wt 228.6 lb

## 2016-06-14 DIAGNOSIS — Z1211 Encounter for screening for malignant neoplasm of colon: Secondary | ICD-10-CM

## 2016-06-14 MED ORDER — SUPREP BOWEL PREP KIT 17.5-3.13-1.6 GM/177ML PO SOLN: 1.0000 | Freq: Once | ORAL | 0 refills | Status: AC

## 2016-06-14 NOTE — Progress Notes (Signed)
No allergies to eggs or soy No diet meds No home oxygen No past problems with anesthesia  Never had MRSA or Cdiff  Declined emmi

## 2016-06-25 ENCOUNTER — Telehealth: Payer: Self-pay | Admitting: Neurology

## 2016-06-25 NOTE — Telephone Encounter (Signed)
Patient states that he will need a 90 day supply of the carbidopa levodopa due to his ins or he will have to pay out of pocket patient phone number is [phone]

## 2016-06-26 ENCOUNTER — Encounter: Payer: Self-pay | Admitting: *Deleted

## 2016-06-26 ENCOUNTER — Other Ambulatory Visit: Payer: Self-pay | Admitting: *Deleted

## 2016-06-26 MED ORDER — CARBIDOPA-LEVODOPA 25-100 MG PO TABS: 1.0000 | ORAL_TABLET | Freq: Every day | ORAL | 3 refills | Status: DC

## 2016-06-26 MED ORDER — CARBIDOPA-LEVODOPA ER 50-200 MG PO TBCR: 1.0000 | EXTENDED_RELEASE_TABLET | Freq: Two times a day (BID) | ORAL | 3 refills | Status: DC

## 2016-06-26 NOTE — Telephone Encounter (Signed)
90day supply sent.

## 2016-06-26 NOTE — Telephone Encounter (Signed)
90 day supply sent.  Notified patient via My Chart.

## 2016-06-28 ENCOUNTER — Encounter: Payer: Managed Care, Other (non HMO) | Admitting: Internal Medicine

## 2016-07-09 ENCOUNTER — Other Ambulatory Visit (HOSPITAL_COMMUNITY): Payer: Managed Care, Other (non HMO)

## 2016-07-11 ENCOUNTER — Other Ambulatory Visit (HOSPITAL_COMMUNITY): Payer: Managed Care, Other (non HMO)

## 2016-07-20 ENCOUNTER — Inpatient Hospital Stay: Admit: 2016-07-20 | Payer: Managed Care, Other (non HMO) | Admitting: Orthopedic Surgery

## 2016-07-20 SURGERY — ARTHROPLASTY, HIP, TOTAL,POSTERIOR APPROACH
Anesthesia: General | Laterality: Left

## 2016-08-06 ENCOUNTER — Telehealth: Payer: Self-pay | Admitting: Neurology

## 2016-08-06 NOTE — Telephone Encounter (Signed)
Please advise 

## 2016-08-06 NOTE — Telephone Encounter (Signed)
Patient will come in on Wednesday at 11:00.

## 2016-08-06 NOTE — Telephone Encounter (Signed)
Recommend that we discuss his medication in the office as we may need to reduce his Requip.  There is an opening on 5/2 at 11am, if he is available.  Caileen Veracruz K. Posey Pronto, DO

## 2016-08-06 NOTE — Telephone Encounter (Signed)
Caller: PT  Urgent? No  Reason for the call: RLS is becoming more painful

## 2016-08-08 ENCOUNTER — Encounter: Payer: Self-pay | Admitting: Neurology

## 2016-08-08 ENCOUNTER — Ambulatory Visit (INDEPENDENT_AMBULATORY_CARE_PROVIDER_SITE_OTHER): Payer: Managed Care, Other (non HMO) | Admitting: Neurology

## 2016-08-08 VITALS — BP 144/90 | HR 84 | Ht 68.5 in | Wt 222.1 lb

## 2016-08-08 DIAGNOSIS — G2581 Restless legs syndrome: Secondary | ICD-10-CM

## 2016-08-08 MED ORDER — ROPINIROLE HCL 1 MG PO TABS: 1.0000 mg | ORAL_TABLET | Freq: Every day | ORAL | 5 refills | Status: DC

## 2016-08-08 MED ORDER — GABAPENTIN 300 MG PO CAPS: 300.0000 mg | ORAL_CAPSULE | Freq: Two times a day (BID) | ORAL | 5 refills | Status: DC

## 2016-08-08 NOTE — Patient Instructions (Addendum)
1.  Reduce requip and start gabapentin as follows:     Requip    Gabapentin  Week 1: 4mg  at bedtime  300mg  at bedtime  Week 2 3mg  at bedtime  300mg  at bedtime  Week 3: 2mg  at bedtime  300mg  twice daily (morning and bedtime)  Week 4: 1mg  at bedtime  300mg  twice daily (morning and bedtime)  Week 5: 0.5mg  at bedtime  300mg  twice daily (morning and bedtime)  Week 6: Stop Requip   Continue 300mg  twice daily (morning and bedtime)   2.  Continue sinemet 25/100mg  at bedtime  3.  Return to clinic in 3-4 months

## 2016-08-08 NOTE — Progress Notes (Signed)
Follow-up Visit   Date: 08/08/16    Patrick [name] MRN: [id number] DOB: 1965-11-25   Interim History: Patrick [name] is a 51 y.o. right-handed Caucasian male with restless leg syndrome and tobacco abuse returning to the clinic for follow-up of RLS.  The patient was accompanied to the clinic by self.  History of present illness: He reports having a long history of bilateral upper thigh discomfort for at least the past 20 years.  Pain is described as "crawling sensation, tiny cramps".  Pain is localized at the lateral thighs bilaterally. Walking and squatting seems to alleviate discomfort.  He has noticed that chocolate and stress exacerbates symptoms.  He has not noticed a significant worsening of symptoms at rest, but pain is more bothersome at night.  He has been on requip for at least the past 10 years and has been on requip 4mg  which makes him very sleepy.  He was started on sinemet 25/100  1 tablet daily.  He was previously taking 1 tablet as needed per night.  Over the last 6-8 months, he was stressed due to personal relationship issues and started taking 3-4 tablets per night.  Since moving out and living separate last week, his leg discomfort has improved and he has only needed to take it twice over the past week.  He attributes the severe worsening of discomfort to his level of stress now that he is doing much better.   UPDATE 04/12/2016:   Starting around the summer of 2017, he began noticing worsening restless sensation of the legs.  Now, he has restlessness within 10-minutes of driving.  Because symptoms were so severe, he has needed to take sinemet 1.5 tab 1 hour prior to leaving work, just so that he can drive home.  The sinemet used to provide relief for 4 hours, now he only has 1.5 hours of relief. Previously, he was able to sleep through the night on Requip 4mg  at bedtime, but now he gets about 4 hours of sleep. He is extremely frustrated and aggravated by his discomfort.   He  has not been on any other medication for RLS.   UPDATE 08/08/2016:  Patient scheduled sooner return visit due to worsening RLS.  At his last visit, there was concern whether his worsening restless leg was due to augmentation or actual worsening of the disease itself, so I offered to increase his ropinirole by adding an extra 2 mg at bedtime in addition to his 4 mg dose. Since doing this, he has not noticed any benefit, and feels that symptoms may actually be worsening. He continues to Sinemet at bedtime as needed which provides relief.  Restless leg symptoms wake him up within 3 hours of sleeping and he has difficulty falling asleep again.  He has lost 30lb since his last visit with life style modifcation.  He continues to have left hip pain and will be having surgery on June 1. He denies any new numbness, tingling, or weakness.  Medications:  Current Outpatient Prescriptions on File Prior to Visit  Medication Sig Dispense Refill  . carbidopa-levodopa (SINEMET CR) 50-200 MG tablet Take 1 tablet by mouth 2 (two) times daily. 180 tablet 3  . carbidopa-levodopa (SINEMET IR) 25-100 MG tablet Take 1-1.5 tablets by mouth at bedtime. 120 tablet 3  . diphenoxylate-atropine (LOMOTIL) 2.5-0.025 MG tablet Take 2 tablets initially then 1 tablet every 4 hours as needed for diarrhea. 30 tablet 0  . HYDROcodone-acetaminophen (NORCO/VICODIN) 5-325 MG tablet Take 1 tablet  by mouth every 6 (six) hours as needed for moderate pain.    Marland Kitchen ibuprofen (ADVIL,MOTRIN) 200 MG tablet Take 600 mg by mouth every 6 (six) hours as needed.    . meloxicam (MOBIC) 15 MG tablet Take 1 tablet (15 mg total) by mouth daily. 90 tablet 1  . methocarbamol (ROBAXIN) 500 MG tablet     . rOPINIRole (REQUIP) 4 MG tablet Take half-tablet at 5pm and 1 tablet at bedtime. 140 tablet 3  . traMADol (ULTRAM) 50 MG tablet Take 1 tablet (50 mg total) by mouth every 6 (six) hours as needed. for pain 90 tablet 0  . clonazePAM (KLONOPIN) 0.5 MG tablet Take 1  tablet (0.5 mg total) by mouth 2 (two) times daily as needed for anxiety. 60 tablet 5   No current facility-administered medications on file prior to visit.     Allergies:  Allergies  Allergen Reactions  . Banana Nausea And Vomiting    Review of Systems:  CONSTITUTIONAL: No fevers, chills, night sweats, or weight loss.  EYES: No visual changes or eye pain ENT: No hearing changes.  No history of nose bleeds.   RESPIRATORY: No cough, wheezing and shortness of breath.   CARDIOVASCULAR: Negative for chest pain, and palpitations.   GI: Negative for abdominal discomfort, blood in stools or black stools.  No recent change in bowel habits.   GU:  No history of incontinence.   MUSCLOSKELETAL: +history of joint pain or swelling.  No myalgias.   SKIN: Negative for lesions, rash, and itching.   ENDOCRINE: Negative for cold or heat intolerance, polydipsia or goiter.   PSYCH:  No depression or anxiety symptoms.   NEURO: As Above.   Vital Signs:  BP (!) 144/90   Pulse 84   Ht 5' 8.5" (1.74 m)   Wt 222 lb 1 oz (100.7 kg)   SpO2 96%   BMI 33.27 kg/m   Neurological Exam: MENTAL STATUS including orientation to time, place, person, recent and remote memory, attention span and concentration, language, and fund of knowledge is normal.  Speech is not dysarthric.  CRANIAL NERVES: Face is symmetric.   MOTOR:  Motor strength is 5/5 in all extremities, except LLE is limited in strength testing due to left hip pain.    MSRs:  Reflexes are 2+/4 throughout.  SENSORY:  Intact to vibration.  COORDINATION/GAIT:  Gait is antalgic due to left hip pain.    Data: Lab Results  Component Value Date   FERRITIN 136 04/23/2014    IMPRESSION/PLAN: Restless leg syndrome - worsening and likely due to augmentation of dopamine agonist as there was no improvement with increasing the dose of ropinirole.  At this juncture, I will slowly taper him off ropinirole and while doing this, start him on gabapentin 300  mg at bedtime.  He was provided with specific instructions on how to make these medication changes in his AVS. He may continue sinemet 25/100 1-1.5 tablet daily as needed.  Return to clinic in 3-4 monts   The duration of this appointment visit was 25 minutes of face-to-face time with the patient.  Greater than 50% of this time was spent in counseling, explanation of diagnosis, planning of further management, and coordination of care.   Thank you for allowing me to participate in patient's care.  If I can answer any additional questions, I would be pleased to do so.    Sincerely,    Camerin Ladouceur K. Posey Pronto, DO

## 2016-08-18 ENCOUNTER — Encounter: Payer: Self-pay | Admitting: Neurology

## 2016-08-19 ENCOUNTER — Ambulatory Visit: Payer: Self-pay | Admitting: Physician Assistant

## 2016-08-19 NOTE — H&P (Signed)
TOTAL HIP ADMISSION H&P  Patient is admitted for left total hip arthroplasty.  Subjective:  Chief Complaint: left hip pain  HPI: Patrick [name], 51 y.o. male, has a history of pain and functional disability in the left hip(s) due to arthritis and secondary to AVN and collapse and patient has failed non-surgical conservative treatments for greater than 12 weeks to include NSAID's and/or analgesics, use of assistive devices, weight reduction as appropriate and activity modification.  Onset of symptoms was gradual starting >10 years ago with gradually worsening course since that time.The patient noted no past surgery on the left hip(s).  Patient currently rates pain in the left hip at 10 out of 10 with activity. Patient has night pain, worsening of pain with activity and weight bearing, trendelenberg gait, pain that interfers with activities of daily living, pain with passive range of motion, crepitus and joint swelling. Patient has evidence of periarticular osteophytes, joint space narrowing and total loss of [place] femoral head with superior migration into acetabulum  by imaging studies. This condition presents safety issues increasing the risk of falls. There is no current active infection.  Patient Active Problem List   Diagnosis Date Noted  . Obesity 02/23/2014  . Impaired glucose tolerance 02/23/2014  . Influenza 05/05/2012  . Chest pain 08/29/2010  . DEHYDRATION 11/24/2009  . TOBACCO USER 11/24/2009  . GASTROENTERITIS, ACUTE 11/24/2009  . ABDOMINAL PAIN, EPIGASTRIC 11/24/2009  . BENIGN PROSTATIC HYPERTROPHY, MILD, HX OF 10/28/2009  . URI 06/01/2008  . Essential hypertension 05/25/2008  . BIPOLAR DISORDER UNSPECIFIED 05/02/2007  . RESTLESS LEG SYNDROME 10/21/2006  . GERD 10/21/2006   Past Medical History:  Diagnosis Date  . Arthritis   . Blood transfusion without reported diagnosis 1984   20+ units  . RLS (restless legs syndrome)   . Tobacco use     Past Surgical History:   Procedure Laterality Date  . arm surgery  1984   MVI  . CARDIAC SURGERY  1984   MVI  . CHOLECYSTECTOMY    . HIP SURGERY  1984   MVI  . KIDNEY SURGERY  1984   MVI  . MANDIBLE FRACTURE SURGERY  1984   MVI     (Not in a hospital admission) Allergies  Allergen Reactions  . Banana Nausea And Vomiting    Social History  Substance Use Topics  . Smoking status: Current Every Day Smoker    Packs/day: 1.00    Years: 35.00    Types: Cigarettes  . Smokeless tobacco: Never Used     Comment: cut back from 1 pack to 1/2 pack day - 35x years  . Alcohol use 0.0 oz/week     Comment: twice a month    Family History  Problem Relation Age of Onset  . Leukemia Father 6       Due to agent orange, deceased  . Hearing loss Mother        Living  . Diabetes Brother   . Obesity Brother   . Healthy Brother   . Alcohol abuse Sister   . Colon cancer Neg Hx      Review of Systems  Constitutional: Positive for weight loss.  Musculoskeletal: Positive for joint pain.  All other systems reviewed and are negative.   Objective:  Physical Exam  Constitutional: He is oriented to person, place, and time. He appears well-developed and well-nourished. No distress.  HENT:  Head: Normocephalic and atraumatic.  Nose: Nose normal.  Eyes: Conjunctivae and EOM are normal. Pupils are  equal, round, and reactive to light.  Neck: Normal range of motion. Neck supple.  Cardiovascular: Normal rate, regular rhythm, normal heart sounds and intact distal pulses.   Respiratory: Effort normal and breath sounds normal. No respiratory distress.  GI: Soft. Bowel sounds are normal. He exhibits no distension. There is no tenderness.  Musculoskeletal:       Left hip: He exhibits decreased range of motion, decreased strength, tenderness and bony tenderness.  Neurological: He is alert and oriented to person, place, and time. No cranial nerve deficit.  Skin: Skin is warm and dry. No rash noted. No erythema.   Psychiatric: He has a normal mood and affect. His behavior is normal.    Vital signs in last 24 hours: BP wrist 186/116, arm 154/89 HR 80 RR 18  Labs:   Estimated body mass index is 33.27 kg/m as calculated from the following:   Height as of 08/08/16: 5' 8.5" (1.74 m).   Weight as of 08/08/16: 100.7 kg (222 lb 1 oz).   Imaging Review Plain radiographs demonstrate severe degenerative joint disease of the left hip(s). The bone quality appears to be good for age and reported activity level.  Assessment/Plan:  End stage arthritis, left hip(s)  The patient history, physical examination, clinical judgement of the provider and imaging studies are consistent with end stage degenerative joint disease of the left hip(s) and total hip arthroplasty is deemed medically necessary. The treatment options including medical management, injection therapy, arthroscopy and arthroplasty were discussed at length. The risks and benefits of total hip arthroplasty were presented and reviewed. The risks due to aseptic loosening, infection, stiffness, dislocation/subluxation,  thromboembolic complications and other imponderables were discussed.  The patient acknowledged the explanation, agreed to proceed with the plan and consent was signed. Patient is being admitted for inpatient treatment for surgery, pain control, PT, OT, prophylactic antibiotics, VTE prophylaxis, progressive ambulation and ADL's and discharge planning.The patient is planning to be discharged home with home health services

## 2016-08-20 ENCOUNTER — Encounter: Payer: Self-pay | Admitting: *Deleted

## 2016-08-27 ENCOUNTER — Encounter (HOSPITAL_COMMUNITY)
Admission: RE | Admit: 2016-08-27 | Discharge: 2016-08-27 | Disposition: A | Discharge status: Home / Self Care | Payer: Managed Care, Other (non HMO) | Source: Ambulatory Visit | Attending: Orthopedic Surgery | Admitting: Orthopedic Surgery

## 2016-08-27 ENCOUNTER — Ambulatory Visit (HOSPITAL_COMMUNITY)
Admission: RE | Admit: 2016-08-27 | Discharge: 2016-08-27 | Disposition: A | Discharge status: Home / Self Care | Payer: Managed Care, Other (non HMO) | Source: Ambulatory Visit | Attending: Physician Assistant | Admitting: Physician Assistant

## 2016-08-27 ENCOUNTER — Encounter (HOSPITAL_COMMUNITY): Payer: Self-pay

## 2016-08-27 DIAGNOSIS — M1612 Unilateral primary osteoarthritis, left hip: Secondary | ICD-10-CM

## 2016-08-27 DIAGNOSIS — Z01812 Encounter for preprocedural laboratory examination: Secondary | ICD-10-CM | POA: Insufficient documentation

## 2016-08-27 DIAGNOSIS — Z01810 Encounter for preprocedural cardiovascular examination: Secondary | ICD-10-CM | POA: Diagnosis present

## 2016-08-27 DIAGNOSIS — F17200 Nicotine dependence, unspecified, uncomplicated: Secondary | ICD-10-CM | POA: Insufficient documentation

## 2016-08-27 DIAGNOSIS — Z01818 Encounter for other preprocedural examination: Secondary | ICD-10-CM | POA: Insufficient documentation

## 2016-08-27 DIAGNOSIS — G2581 Restless legs syndrome: Secondary | ICD-10-CM | POA: Diagnosis not present

## 2016-08-27 LAB — CBC WITH DIFFERENTIAL/PLATELET
Basophils Absolute: 0 10*3/uL (ref 0.0–0.1)
Basophils Relative: 0 %
EOS ABS: 0.1 10*3/uL (ref 0.0–0.7)
EOS PCT: 1 %
HCT: 44.7 % (ref 39.0–52.0)
Hemoglobin: 14.5 g/dL (ref 13.0–17.0)
LYMPHS ABS: 2.7 10*3/uL (ref 0.7–4.0)
Lymphocytes Relative: 26 %
MCH: 30.9 pg (ref 26.0–34.0)
MCHC: 32.4 g/dL (ref 30.0–36.0)
MCV: 95.1 fL (ref 78.0–100.0)
MONO ABS: 0.8 10*3/uL (ref 0.1–1.0)
MONOS PCT: 8 %
Neutro Abs: 6.8 10*3/uL (ref 1.7–7.7)
Neutrophils Relative %: 65 %
PLATELETS: 230 10*3/uL (ref 150–400)
RBC: 4.7 MIL/uL (ref 4.22–5.81)
RDW: 12.7 % (ref 11.5–15.5)
WBC: 10.4 10*3/uL (ref 4.0–10.5)

## 2016-08-27 LAB — URINALYSIS, ROUTINE W REFLEX MICROSCOPIC
Bilirubin Urine: NEGATIVE
Glucose, UA: NEGATIVE mg/dL
Hgb urine dipstick: NEGATIVE
KETONES UR: NEGATIVE mg/dL
LEUKOCYTES UA: NEGATIVE
Nitrite: NEGATIVE
PROTEIN: NEGATIVE mg/dL
Specific Gravity, Urine: 1.009 (ref 1.005–1.030)
pH: 5 (ref 5.0–8.0)

## 2016-08-27 LAB — COMPREHENSIVE METABOLIC PANEL
ALK PHOS: 70 U/L (ref 38–126)
ALT: 8 U/L — ABNORMAL LOW (ref 17–63)
ANION GAP: 9 (ref 5–15)
AST: 14 U/L — ABNORMAL LOW (ref 15–41)
Albumin: 3.8 g/dL (ref 3.5–5.0)
BUN: 6 mg/dL (ref 6–20)
CHLORIDE: 104 mmol/L (ref 101–111)
CO2: 26 mmol/L (ref 22–32)
Calcium: 9.1 mg/dL (ref 8.9–10.3)
Creatinine, Ser: 0.89 mg/dL (ref 0.61–1.24)
GFR calc Af Amer: >60 mL/min (ref >60)
GFR calc non Af Amer: >60 mL/min (ref >60)
Glucose, Bld: 96 mg/dL (ref 65–99)
Potassium: 4.1 mmol/L (ref 3.5–5.1)
SODIUM: 139 mmol/L (ref 135–145)
Total Bilirubin: 0.8 mg/dL (ref 0.3–1.2)
Total Protein: 6.9 g/dL (ref 6.5–8.1)

## 2016-08-27 LAB — APTT: aPTT: 34 seconds (ref 24–36)

## 2016-08-27 LAB — PROTIME-INR
INR: 1.06
Prothrombin Time: 13.9 seconds (ref 11.4–15.2)

## 2016-08-27 LAB — SURGICAL PCR SCREEN
MRSA, PCR: NEGATIVE
Staphylococcus aureus: POSITIVE — AB

## 2016-08-27 NOTE — Pre-Procedure Instructions (Signed)
Patrick [name]  08/27/2016      [name] [address] [name], [place] [name] [address] Phone: [phone] Fax: [phone]    Your procedure is scheduled on 09-07-2016  Friday   Report to [name] Admitting at 5:30 A.M.   Call this number if you have problems the morning of surgery:  [phone]   Remember:  Do not eat food or drink liquids after midnight.   Take these medicines the morning of surgery with A SIP OF WATER Sinemet,methocarbamol/Robaxin if needed    STOP ASPIRIN,ANTIINFLAMATORIES (IBUPROFEN,ALEVE,MOTRIN,ADVIL,GOODY'S POWDERS),HERBAL SUPPLEMENTS,FISH OIL,AND VITAMINS 5-7 DAYS PRIOR TO SURGERY   Do not wear jewelry.  Do not wear lotions, powders, or perfumes, or deoderant.  Do not shave 48 hours prior to surgery.  Men may shave face and neck.  Do not bring valuables to the [name].  [name] is not responsible for any belongings or valuables.  Contacts, dentures or bridgework may not be worn into surgery.  Leave your suitcase in the car.  After surgery it may be brought to your room.  For patients admitted to the [name], discharge time will be determined by your treatment team.  Patients discharged the day of surgery will not be allowed to drive home.   Special Instructions:  - Preparing for Surgery  Before surgery, you can play an important role.  Because skin is not sterile, your skin needs to be as free of germs as possible.  You can reduce the number of germs on you skin by washing with CHG (chlorahexidine gluconate) soap before surgery.  CHG is an antiseptic cleaner which kills germs and bonds with the skin to continue killing germs even after washing.  Please DO NOT use if you have an allergy to CHG or antibacterial soaps.  If your skin becomes reddened/irritated stop using the CHG and inform your nurse when you arrive at Short Stay.  Do not shave (including legs and  underarms) for at least 48 hours prior to the first CHG shower.  You may shave your face.  Please follow these instructions carefully:   1.  Shower with CHG Soap the night before surgery and the   morning of Surgery.  2.  If you choose to wash your hair, wash your hair first as usual with your normal shampoo.  3.  After you shampoo, rinse your hair and body thoroughly to remove the  Shampoo.  4.  Use CHG as you would any other liquid soap.  You can apply chg directly  to the skin and wash gently with scrungie or a clean washcloth.  5.  Apply the CHG Soap to your body ONLY FROM THE NECK DOWN.   Do not use on open wounds or open sores.  Avoid contact with your eyes,  ears, mouth and genitals (private parts).  Wash genitals (private parts) with your normal soap.  6.  Wash thoroughly, paying special attention to the area where your surgery will be performed.  7.  Thoroughly rinse your body with warm water from the neck down.  8.  DO NOT shower/wash with your normal soap after using and rinsing o  the CHG Soap.  9.  Pat yourself dry with a clean towel.            10.  Wear clean pajamas.            11.  Place clean sheets on your bed  the night of your first shower and do not sleep with pets.  Day of Surgery  Do not apply any lotions/deodorants the morning of surgery.  Please wear clean clothes to the [name]/surgery [name].    Please read over the following fact sheets that you were given. MRSA Information and Surgical Site Infection Prevention,Incentive Spirometry

## 2016-08-27 NOTE — Progress Notes (Signed)
PCP  Dr. Bluford Kaufmann    Does not see a cardiologist   Stress test in 2012  No other cardiac testing done  Denies any chest pain or discomfort  recently

## 2016-08-27 NOTE — Progress Notes (Signed)
Message left on Patrick [name]'s machine to inform him of results of PCR- positive for MSSA  And informed of the need to pick up script for Mupirocin.    Prescription called into [facility] in [place]

## 2016-08-28 LAB — URINE CULTURE: Culture: NO GROWTH

## 2016-08-28 NOTE — Progress Notes (Addendum)
Anesthesia Chart Review:  Pt is a 51 year old male scheduled for L total hip arthroplasty on 09/07/2016 with Earlie Server, MD  - Bluford Kaufmann, MD  PMH includes:  Arthritis, restless leg syndrome. Current smoker. BMI 32.5  Medications include: carbidopa-levodopa.   Preoperative labs reviewed.    CXR 08/27/16: Postoperative chest.  No evidence of active disease.  EKG 08/27/16: NSR. Possible Inferior infarct, age undetermined. Appears stable when compared to EKG 08/28/10 [facility])  Nuclear stress test 09/05/10: Low risk stress nuclear study.  He had mild chest pain during the study but no significant ECG changes to suggest ischemia.  There is a small inferior basal defect that appears to be consistent with diaphragmatic attenuation.  The LV function is normal.  If no changes, I anticipate pt can proceed with surgery as scheduled.   Willeen Cass, FNP-BC [facility]/Anesthesiology Phone: [phone] 08/29/2016 12:08 PM

## 2016-08-29 ENCOUNTER — Ambulatory Visit: Payer: Managed Care, Other (non HMO) | Admitting: Neurology

## 2016-08-30 ENCOUNTER — Encounter: Payer: Self-pay | Admitting: Neurology

## 2016-08-30 MED ORDER — GABAPENTIN 300 MG PO CAPS: 600.0000 mg | ORAL_CAPSULE | Freq: Every day | ORAL | 3 refills | Status: DC

## 2016-09-06 MED ORDER — CEFAZOLIN SODIUM-DEXTROSE 2-4 GM/100ML-% IV SOLN
2.0000 g | INTRAVENOUS | Status: DC
  Filled 2016-09-06: qty 100

## 2016-09-06 MED ORDER — SODIUM CHLORIDE 0.9 % IV SOLN: INTRAVENOUS | Status: DC

## 2016-09-06 MED ORDER — TRANEXAMIC ACID 1000 MG/10ML IV SOLN
1000.0000 mg | INTRAVENOUS | Status: AC
  Administered 2016-09-07: 1000 mg via INTRAVENOUS
  Filled 2016-09-06: qty 10

## 2016-09-06 NOTE — Anesthesia Preprocedure Evaluation (Addendum)
Anesthesia Evaluation  Patient identified by MRN, date of birth, ID band Patient awake    Reviewed: Allergy & Precautions, NPO status , Patient's Chart, lab work & pertinent test results  Airway Mallampati: II  TM Distance: >3 FB Neck ROM: Full    Dental  (+) Dental Advisory Given   Pulmonary Current Smoker,    breath sounds clear to auscultation       Cardiovascular  Rhythm:Regular Rate:Normal     Neuro/Psych negative neurological ROS     GI/Hepatic Neg liver ROS, GERD  ,  Endo/Other  negative endocrine ROS  Renal/GU negative Renal ROS     Musculoskeletal   Abdominal   Peds  Hematology negative hematology ROS (+)   Anesthesia Other Findings   Reproductive/Obstetrics                            Lab Results  Component Value Date   WBC 10.4 08/27/2016   HGB 14.5 08/27/2016   HCT 44.7 08/27/2016   MCV 95.1 08/27/2016   PLT 230 08/27/2016   Lab Results  Component Value Date   CREATININE 0.89 08/27/2016   BUN 6 08/27/2016   NA 139 08/27/2016   K 4.1 08/27/2016   CL 104 08/27/2016   CO2 26 08/27/2016   Lab Results  Component Value Date   INR 1.06 08/27/2016    Anesthesia Physical Anesthesia Plan  ASA: II  Anesthesia Plan: General   Post-op Pain Management:    Induction: Intravenous  Airway Management Planned: Oral ETT  Additional Equipment:   Intra-op Plan:   Post-operative Plan: Extubation in OR  Informed Consent: I have reviewed the patients History and Physical, chart, labs and discussed the procedure including the risks, benefits and alternatives for the proposed anesthesia with the patient or authorized representative who has indicated his/her understanding and acceptance.   Dental advisory given  Plan Discussed with: CRNA  Anesthesia Plan Comments: (Pt declines spinal anesthesia. BP high this AM. PCP notes dating back 2 years reveal normal to high-normal  BPs. Pt very nervous. Likely "white coat syndrome". Risks and benefits of anesthesia discussed.)       Anesthesia Quick Evaluation

## 2016-09-07 ENCOUNTER — Inpatient Hospital Stay (HOSPITAL_COMMUNITY): Payer: 59 | Admitting: Emergency Medicine

## 2016-09-07 ENCOUNTER — Inpatient Hospital Stay (HOSPITAL_COMMUNITY): Payer: 59

## 2016-09-07 ENCOUNTER — Encounter (HOSPITAL_COMMUNITY)
Admission: RE | Disposition: A | Discharge status: Home / Self Care | Payer: Self-pay | Source: Ambulatory Visit | Attending: Orthopedic Surgery

## 2016-09-07 ENCOUNTER — Inpatient Hospital Stay (HOSPITAL_COMMUNITY)
Admission: RE | Admit: 2016-09-07 | Discharge: 2016-09-08 | DRG: 470 | Disposition: A | Discharge status: Home / Self Care | Payer: 59 | Source: Ambulatory Visit | Attending: Orthopedic Surgery | Admitting: Orthopedic Surgery

## 2016-09-07 DIAGNOSIS — Z96649 Presence of unspecified artificial hip joint: Secondary | ICD-10-CM

## 2016-09-07 DIAGNOSIS — F17210 Nicotine dependence, cigarettes, uncomplicated: Secondary | ICD-10-CM | POA: Diagnosis present

## 2016-09-07 DIAGNOSIS — M879 Osteonecrosis, unspecified: Secondary | ICD-10-CM | POA: Diagnosis present

## 2016-09-07 DIAGNOSIS — M1612 Unilateral primary osteoarthritis, left hip: Principal | ICD-10-CM | POA: Diagnosis present

## 2016-09-07 DIAGNOSIS — G2581 Restless legs syndrome: Secondary | ICD-10-CM | POA: Diagnosis present

## 2016-09-07 DIAGNOSIS — M25552 Pain in left hip: Secondary | ICD-10-CM | POA: Diagnosis present

## 2016-09-07 DIAGNOSIS — Z91018 Allergy to other foods: Secondary | ICD-10-CM

## 2016-09-07 DIAGNOSIS — Z96642 Presence of left artificial hip joint: Secondary | ICD-10-CM

## 2016-09-07 HISTORY — PX: TOTAL HIP ARTHROPLASTY: SHX124

## 2016-09-07 LAB — HEMOGLOBIN AND HEMATOCRIT, BLOOD
HCT: 36.8 % — ABNORMAL LOW (ref 39.0–52.0)
HEMOGLOBIN: 11.7 g/dL — AB (ref 13.0–17.0)

## 2016-09-07 LAB — TYPE AND SCREEN
ABO/RH(D): O NEG
ANTIBODY SCREEN: POSITIVE

## 2016-09-07 SURGERY — ARTHROPLASTY, HIP, TOTAL,POSTERIOR APPROACH
Anesthesia: General | Site: Hip | Laterality: Left

## 2016-09-07 MED ORDER — ROCURONIUM BROMIDE 10 MG/ML (PF) SYRINGE
PREFILLED_SYRINGE | INTRAVENOUS | Status: DC | PRN
  Administered 2016-09-07: 50 mg via INTRAVENOUS
  Administered 2016-09-07: 20 mg via INTRAVENOUS
  Administered 2016-09-07: 30 mg via INTRAVENOUS

## 2016-09-07 MED ORDER — LACTATED RINGERS IV SOLN
INTRAVENOUS | Status: DC | PRN
  Administered 2016-09-07 (×3): via INTRAVENOUS

## 2016-09-07 MED ORDER — CEFAZOLIN SODIUM-DEXTROSE 2-3 GM-% IV SOLR
INTRAVENOUS | Status: DC | PRN
  Administered 2016-09-07: 2 g via INTRAVENOUS

## 2016-09-07 MED ORDER — METHOCARBAMOL 1000 MG/10ML IJ SOLN
500.0000 mg | Freq: Four times a day (QID) | INTRAVENOUS | Status: DC | PRN
  Filled 2016-09-07: qty 5

## 2016-09-07 MED ORDER — LABETALOL HCL 5 MG/ML IV SOLN
INTRAVENOUS | Status: AC
  Filled 2016-09-07: qty 4

## 2016-09-07 MED ORDER — PROPOFOL 10 MG/ML IV BOLUS
INTRAVENOUS | Status: DC | PRN
Start: 2016-09-07 — End: 2016-09-07
  Administered 2016-09-07: 200 mg via INTRAVENOUS

## 2016-09-07 MED ORDER — PHENYLEPHRINE 40 MCG/ML (10ML) SYRINGE FOR IV PUSH (FOR BLOOD PRESSURE SUPPORT)
PREFILLED_SYRINGE | INTRAVENOUS | Status: DC | PRN
  Administered 2016-09-07: 80 ug via INTRAVENOUS

## 2016-09-07 MED ORDER — BUPIVACAINE-EPINEPHRINE 0.5% -1:200000 IJ SOLN
INTRAMUSCULAR | Status: DC | PRN
  Administered 2016-09-07: 30 mL

## 2016-09-07 MED ORDER — FENTANYL CITRATE (PF) 250 MCG/5ML IJ SOLN
INTRAMUSCULAR | Status: DC | PRN
  Administered 2016-09-07 (×7): 50 ug via INTRAVENOUS
  Administered 2016-09-07: 150 ug via INTRAVENOUS

## 2016-09-07 MED ORDER — PROPOFOL 1000 MG/100ML IV EMUL
INTRAVENOUS | Status: AC
  Filled 2016-09-07: qty 100

## 2016-09-07 MED ORDER — PHENYLEPHRINE 40 MCG/ML (10ML) SYRINGE FOR IV PUSH (FOR BLOOD PRESSURE SUPPORT)
PREFILLED_SYRINGE | INTRAVENOUS | Status: AC
  Filled 2016-09-07: qty 10

## 2016-09-07 MED ORDER — OXYCODONE HCL 5 MG PO TABS: 5.0000 mg | ORAL_TABLET | ORAL | Status: DC | PRN

## 2016-09-07 MED ORDER — MIDAZOLAM HCL 2 MG/2ML IJ SOLN
INTRAMUSCULAR | Status: AC
  Filled 2016-09-07: qty 2

## 2016-09-07 MED ORDER — TRANEXAMIC ACID 1000 MG/10ML IV SOLN
1000.0000 mg | Freq: Once | INTRAVENOUS | Status: AC
  Administered 2016-09-07: 1000 mg via INTRAVENOUS
  Filled 2016-09-07: qty 10

## 2016-09-07 MED ORDER — ONDANSETRON HCL 4 MG/2ML IJ SOLN
INTRAMUSCULAR | Status: AC
  Filled 2016-09-07: qty 2

## 2016-09-07 MED ORDER — SODIUM CHLORIDE 0.9 % IR SOLN
Status: DC | PRN
  Administered 2016-09-07: 3000 mL

## 2016-09-07 MED ORDER — MAGNESIUM CITRATE PO SOLN: 1.0000 | Freq: Once | ORAL | Status: DC | PRN

## 2016-09-07 MED ORDER — DOCUSATE SODIUM 100 MG PO CAPS
100.0000 mg | ORAL_CAPSULE | Freq: Two times a day (BID) | ORAL | Status: DC
  Filled 2016-09-07 (×2): qty 1

## 2016-09-07 MED ORDER — METOCLOPRAMIDE HCL 5 MG/ML IJ SOLN: 5.0000 mg | Freq: Three times a day (TID) | INTRAMUSCULAR | Status: DC | PRN

## 2016-09-07 MED ORDER — OXYCODONE-ACETAMINOPHEN 5-325 MG PO TABS: 1.0000 | ORAL_TABLET | Freq: Four times a day (QID) | ORAL | 0 refills | Status: DC | PRN

## 2016-09-07 MED ORDER — POLYETHYLENE GLYCOL 3350 17 G PO PACK: 17.0000 g | PACK | Freq: Every day | ORAL | Status: DC | PRN

## 2016-09-07 MED ORDER — ROPINIROLE HCL 1 MG PO TABS
1.0000 mg | ORAL_TABLET | Freq: Every day | ORAL | Status: DC
  Administered 2016-09-07: 1 mg via ORAL
  Filled 2016-09-07: qty 1

## 2016-09-07 MED ORDER — HYDROMORPHONE HCL 1 MG/ML IJ SOLN: 0.5000 mg | INTRAMUSCULAR | Status: DC | PRN

## 2016-09-07 MED ORDER — ACETAMINOPHEN 650 MG RE SUPP: 650.0000 mg | Freq: Four times a day (QID) | RECTAL | Status: DC | PRN

## 2016-09-07 MED ORDER — OXYCODONE-ACETAMINOPHEN 5-325 MG PO TABS: 1.0000 | ORAL_TABLET | ORAL | Status: DC | PRN

## 2016-09-07 MED ORDER — HYDROMORPHONE HCL 1 MG/ML IJ SOLN
INTRAMUSCULAR | Status: AC
  Filled 2016-09-07: qty 1

## 2016-09-07 MED ORDER — ROCURONIUM BROMIDE 10 MG/ML (PF) SYRINGE
PREFILLED_SYRINGE | INTRAVENOUS | Status: AC
  Filled 2016-09-07: qty 5

## 2016-09-07 MED ORDER — ALBUMIN HUMAN 5 % IV SOLN
INTRAVENOUS | Status: DC | PRN
  Administered 2016-09-07 (×2): via INTRAVENOUS

## 2016-09-07 MED ORDER — CELECOXIB 200 MG PO CAPS
200.0000 mg | ORAL_CAPSULE | Freq: Two times a day (BID) | ORAL | Status: DC
  Filled 2016-09-07 (×3): qty 1

## 2016-09-07 MED ORDER — ASPIRIN 325 MG PO TABS
325.0000 mg | ORAL_TABLET | Freq: Two times a day (BID) | ORAL | Status: DC
  Administered 2016-09-08: 325 mg via ORAL
  Filled 2016-09-07: qty 1

## 2016-09-07 MED ORDER — PROPOFOL 10 MG/ML IV BOLUS
INTRAVENOUS | Status: AC
  Filled 2016-09-07: qty 20

## 2016-09-07 MED ORDER — LABETALOL HCL 5 MG/ML IV SOLN
INTRAVENOUS | Status: DC | PRN
  Administered 2016-09-07 (×4): 5 mg via INTRAVENOUS

## 2016-09-07 MED ORDER — DEXAMETHASONE SODIUM PHOSPHATE 10 MG/ML IJ SOLN
INTRAMUSCULAR | Status: DC | PRN
  Administered 2016-09-07: 10 mg via INTRAVENOUS

## 2016-09-07 MED ORDER — LIDOCAINE 2% (20 MG/ML) 5 ML SYRINGE
INTRAMUSCULAR | Status: AC
  Filled 2016-09-07: qty 5

## 2016-09-07 MED ORDER — POTASSIUM CHLORIDE IN NACL 20-0.9 MEQ/L-% IV SOLN
INTRAVENOUS | Status: DC
  Administered 2016-09-07: 13:00:00 via INTRAVENOUS
  Filled 2016-09-07: qty 1000

## 2016-09-07 MED ORDER — ZOLPIDEM TARTRATE 5 MG PO TABS: 5.0000 mg | ORAL_TABLET | Freq: Every evening | ORAL | Status: DC | PRN

## 2016-09-07 MED ORDER — FENTANYL CITRATE (PF) 250 MCG/5ML IJ SOLN
INTRAMUSCULAR | Status: AC
  Filled 2016-09-07: qty 5

## 2016-09-07 MED ORDER — CHLORHEXIDINE GLUCONATE 4 % EX LIQD: 60.0000 mL | Freq: Once | CUTANEOUS | Status: DC

## 2016-09-07 MED ORDER — SODIUM CHLORIDE 0.9 % IV SOLN
2000.0000 mg | INTRAVENOUS | Status: DC
  Filled 2016-09-07: qty 20

## 2016-09-07 MED ORDER — MIDAZOLAM HCL 2 MG/2ML IJ SOLN
INTRAMUSCULAR | Status: DC | PRN
  Administered 2016-09-07: 2 mg via INTRAVENOUS

## 2016-09-07 MED ORDER — METHOCARBAMOL 500 MG PO TABS
500.0000 mg | ORAL_TABLET | Freq: Four times a day (QID) | ORAL | Status: DC | PRN
  Administered 2016-09-07: 500 mg via ORAL
  Filled 2016-09-07: qty 1

## 2016-09-07 MED ORDER — HYDROMORPHONE HCL 1 MG/ML IJ SOLN
0.2500 mg | INTRAMUSCULAR | Status: DC | PRN
  Administered 2016-09-07: 0.5 mg via INTRAVENOUS

## 2016-09-07 MED ORDER — ONDANSETRON HCL 4 MG/2ML IJ SOLN
INTRAMUSCULAR | Status: DC | PRN
  Administered 2016-09-07: 4 mg via INTRAVENOUS

## 2016-09-07 MED ORDER — 0.9 % SODIUM CHLORIDE (POUR BTL) OPTIME
TOPICAL | Status: DC | PRN
  Administered 2016-09-07: 1000 mL

## 2016-09-07 MED ORDER — ONDANSETRON HCL 4 MG PO TABS: 4.0000 mg | ORAL_TABLET | Freq: Four times a day (QID) | ORAL | Status: DC | PRN

## 2016-09-07 MED ORDER — EPINEPHRINE PF 1 MG/ML IJ SOLN
INTRAMUSCULAR | Status: AC
  Filled 2016-09-07: qty 1

## 2016-09-07 MED ORDER — LIDOCAINE 2% (20 MG/ML) 5 ML SYRINGE
INTRAMUSCULAR | Status: DC | PRN
  Administered 2016-09-07: 60 mg via INTRAVENOUS

## 2016-09-07 MED ORDER — BUPIVACAINE HCL (PF) 0.5 % IJ SOLN
INTRAMUSCULAR | Status: AC
  Filled 2016-09-07: qty 30

## 2016-09-07 MED ORDER — SUGAMMADEX SODIUM 200 MG/2ML IV SOLN
INTRAVENOUS | Status: AC
  Filled 2016-09-07: qty 2

## 2016-09-07 MED ORDER — ALUM & MAG HYDROXIDE-SIMETH 200-200-20 MG/5ML PO SUSP: 30.0000 mL | ORAL | Status: DC | PRN

## 2016-09-07 MED ORDER — PHENOL 1.4 % MT LIQD: 1.0000 | OROMUCOSAL | Status: DC | PRN

## 2016-09-07 MED ORDER — CEFAZOLIN SODIUM-DEXTROSE 2-4 GM/100ML-% IV SOLN
2.0000 g | Freq: Four times a day (QID) | INTRAVENOUS | Status: AC
  Administered 2016-09-07 (×2): 2 g via INTRAVENOUS
  Filled 2016-09-07 (×3): qty 100

## 2016-09-07 MED ORDER — METOCLOPRAMIDE HCL 5 MG PO TABS: 5.0000 mg | ORAL_TABLET | Freq: Three times a day (TID) | ORAL | Status: DC | PRN

## 2016-09-07 MED ORDER — TRANEXAMIC ACID 1000 MG/10ML IV SOLN
INTRAVENOUS | Status: AC | PRN
  Administered 2016-09-07: 2000 mg via TOPICAL

## 2016-09-07 MED ORDER — PROMETHAZINE HCL 25 MG/ML IJ SOLN: 6.2500 mg | INTRAMUSCULAR | Status: DC | PRN

## 2016-09-07 MED ORDER — MENTHOL 3 MG MT LOZG: 1.0000 | LOZENGE | OROMUCOSAL | Status: DC | PRN

## 2016-09-07 MED ORDER — BUPIVACAINE LIPOSOME 1.3 % IJ SUSP
20.0000 mL | INTRAMUSCULAR | Status: AC
  Administered 2016-09-07: 20 mL
  Filled 2016-09-07: qty 20

## 2016-09-07 MED ORDER — ONDANSETRON HCL 4 MG/2ML IJ SOLN: 4.0000 mg | Freq: Four times a day (QID) | INTRAMUSCULAR | Status: DC | PRN

## 2016-09-07 MED ORDER — CARBIDOPA-LEVODOPA ER 50-200 MG PO TBCR
1.0000 | EXTENDED_RELEASE_TABLET | Freq: Two times a day (BID) | ORAL | Status: DC
  Administered 2016-09-07 – 2016-09-08 (×2): 1 via ORAL
  Filled 2016-09-07 (×2): qty 1

## 2016-09-07 MED ORDER — DEXAMETHASONE SODIUM PHOSPHATE 10 MG/ML IJ SOLN
INTRAMUSCULAR | Status: AC
  Filled 2016-09-07: qty 1

## 2016-09-07 MED ORDER — CARBIDOPA-LEVODOPA 25-100 MG PO TABS: 1.0000 | ORAL_TABLET | Freq: Every day | ORAL | Status: DC

## 2016-09-07 MED ORDER — APIXABAN 2.5 MG PO TABS: 2.5000 mg | ORAL_TABLET | Freq: Two times a day (BID) | ORAL | Status: DC

## 2016-09-07 MED ORDER — GABAPENTIN 300 MG PO CAPS
600.0000 mg | ORAL_CAPSULE | Freq: Every day | ORAL | Status: DC
  Administered 2016-09-07: 600 mg via ORAL
  Filled 2016-09-07: qty 2

## 2016-09-07 MED ORDER — SUGAMMADEX SODIUM 200 MG/2ML IV SOLN
INTRAVENOUS | Status: DC | PRN
  Administered 2016-09-07: 200 mg via INTRAVENOUS

## 2016-09-07 MED ORDER — ACETAMINOPHEN 325 MG PO TABS
650.0000 mg | ORAL_TABLET | Freq: Four times a day (QID) | ORAL | Status: DC | PRN
  Administered 2016-09-07: 650 mg via ORAL
  Filled 2016-09-07: qty 2

## 2016-09-07 SURGICAL SUPPLY — 70 items
BIT DRILL 25 QKSET FLEX 1PC (BIT) ×1 IMPLANT
BLADE SAW SAG 73X25 THK (BLADE) ×1
BLADE SAW SGTL 73X25 THK (BLADE) ×1 IMPLANT
BRUSH FEMORAL CANAL (MISCELLANEOUS) IMPLANT
CAPT HIP TOTAL 2 ×1 IMPLANT
COVER SURGICAL LIGHT HANDLE (MISCELLANEOUS) ×2 IMPLANT
DRAPE INCISE IOBAN 66X45 STRL (DRAPES) IMPLANT
DRAPE ORTHO SPLIT 77X108 STRL (DRAPES) ×4
DRAPE SURG ORHT 6 SPLT 77X108 (DRAPES) ×2 IMPLANT
DRAPE U-SHAPE 47X51 STRL (DRAPES) ×2 IMPLANT
DRSG ADAPTIC 3X8 NADH LF (GAUZE/BANDAGES/DRESSINGS) ×2 IMPLANT
DRSG AQUACEL AG ADV 3.5X14 (GAUZE/BANDAGES/DRESSINGS) ×1 IMPLANT
DRSG PAD ABDOMINAL 8X10 ST (GAUZE/BANDAGES/DRESSINGS) ×4 IMPLANT
DURAPREP 26ML APPLICATOR (WOUND CARE) ×2 IMPLANT
ELECT BLADE 4.0 EZ CLEAN MEGAD (MISCELLANEOUS) ×2
ELECT BLADE 6.5 EXT (BLADE) IMPLANT
ELECT CAUTERY BLADE 6.4 (BLADE) ×2 IMPLANT
ELECT REM PT RETURN 9FT ADLT (ELECTROSURGICAL) ×2
ELECTRODE BLDE 4.0 EZ CLN MEGD (MISCELLANEOUS) IMPLANT
ELECTRODE REM PT RTRN 9FT ADLT (ELECTROSURGICAL) ×1 IMPLANT
FACESHIELD WRAPAROUND (MASK) ×4 IMPLANT
FACESHIELD WRAPAROUND OR TEAM (MASK) ×2 IMPLANT
GAUZE SPONGE 4X4 12PLY STRL (GAUZE/BANDAGES/DRESSINGS) ×2 IMPLANT
GLOVE BIOGEL PI IND STRL 8 (GLOVE) ×2 IMPLANT
GLOVE BIOGEL PI INDICATOR 8 (GLOVE) ×2
GLOVE ORTHO TXT STRL SZ7.5 (GLOVE) ×4 IMPLANT
GLOVE SURG ORTHO 8.0 STRL STRW (GLOVE) ×4 IMPLANT
GOWN STRL REUS W/ TWL LRG LVL3 (GOWN DISPOSABLE) ×1 IMPLANT
GOWN STRL REUS W/ TWL XL LVL3 (GOWN DISPOSABLE) ×1 IMPLANT
GOWN STRL REUS W/TWL 2XL LVL3 (GOWN DISPOSABLE) ×2 IMPLANT
GOWN STRL REUS W/TWL LRG LVL3 (GOWN DISPOSABLE) ×2
GOWN STRL REUS W/TWL XL LVL3 (GOWN DISPOSABLE) ×2
HANDPIECE INTERPULSE COAX TIP (DISPOSABLE) ×2
HOOD PEEL AWAY FACE SHEILD DIS (HOOD) ×3 IMPLANT
IMMOBILIZER KNEE 22 (SOFTGOODS) ×1 IMPLANT
IMMOBILIZER KNEE 22 UNIV (SOFTGOODS) ×2 IMPLANT
KIT BASIN OR (CUSTOM PROCEDURE TRAY) ×2 IMPLANT
KIT ROOM TURNOVER OR (KITS) ×2 IMPLANT
MANIFOLD NEPTUNE II (INSTRUMENTS) ×2 IMPLANT
NDL 18GX1X1/2 (RX/OR ONLY) (NEEDLE) IMPLANT
NDL FILTER BLUNT 18X1 1/2 (NEEDLE) IMPLANT
NDL MAYO TROCAR (NEEDLE) IMPLANT
NEEDLE 18GX1X1/2 (RX/OR ONLY) (NEEDLE) ×2 IMPLANT
NEEDLE 22X1 1/2 (OR ONLY) (NEEDLE) ×2 IMPLANT
NEEDLE FILTER BLUNT 18X 1/2SAF (NEEDLE) ×1
NEEDLE FILTER BLUNT 18X1 1/2 (NEEDLE) ×1 IMPLANT
NEEDLE MAYO TROCAR (NEEDLE) IMPLANT
NS IRRIG 1000ML POUR BTL (IV SOLUTION) ×2 IMPLANT
PACK TOTAL JOINT (CUSTOM PROCEDURE TRAY) ×2 IMPLANT
PACK UNIVERSAL I (CUSTOM PROCEDURE TRAY) ×2 IMPLANT
PAD ARMBOARD 7.5X6 YLW CONV (MISCELLANEOUS) ×4 IMPLANT
PENCIL BUTTON HOLSTER BLD 10FT (ELECTRODE) ×1 IMPLANT
PRESSURIZER FEMORAL UNIV (MISCELLANEOUS) IMPLANT
SET HNDPC FAN SPRY TIP SCT (DISPOSABLE) IMPLANT
SPONGE LAP 18X18 X RAY DECT (DISPOSABLE) ×1 IMPLANT
STAPLER VISISTAT 35W (STAPLE) ×2 IMPLANT
SUCTION FRAZIER HANDLE 10FR (MISCELLANEOUS) ×1
SUCTION TUBE FRAZIER 10FR DISP (MISCELLANEOUS) ×1 IMPLANT
SUT ETHIBOND 2 V 37 (SUTURE) ×2 IMPLANT
SUT VIC AB 0 CT1 27 (SUTURE) ×2
SUT VIC AB 0 CT1 27XBRD ANBCTR (SUTURE) ×1 IMPLANT
SUT VIC AB 2-0 CT1 27 (SUTURE) ×4
SUT VIC AB 2-0 CT1 TAPERPNT 27 (SUTURE) ×2 IMPLANT
SYR CONTROL 10ML LL (SYRINGE) ×2 IMPLANT
SYR TB 1ML LUER SLIP (SYRINGE) ×1 IMPLANT
TOWEL OR 17X24 6PK STRL BLUE (TOWEL DISPOSABLE) ×2 IMPLANT
TOWEL OR 17X26 10 PK STRL BLUE (TOWEL DISPOSABLE) ×2 IMPLANT
TOWER CARTRIDGE SMART MIX (DISPOSABLE) IMPLANT
TRAY CATH 16FR W/PLASTIC CATH (SET/KITS/TRAYS/PACK) IMPLANT
WATER STERILE IRR 1000ML POUR (IV SOLUTION) ×1 IMPLANT

## 2016-09-07 NOTE — Interval H&P Note (Signed)
History and Physical Interval Note:  09/07/2016 7:28 AM  Patrick [name]  has presented today for surgery, with the diagnosis of OA LEFT HIP  The various methods of treatment have been discussed with the patient and family. After consideration of risks, benefits and other options for treatment, the patient has consented to  Procedure(s): TOTAL HIP ARTHROPLASTY (Left) as a surgical intervention .  The patient's history has been reviewed, patient examined, no change in status, stable for surgery.  I have reviewed the patient's chart and labs.  Questions were answered to the patient's satisfaction.     Laxmi Choung JR,W D

## 2016-09-07 NOTE — Anesthesia Procedure Notes (Signed)
Procedure Name: Intubation Date/Time: 09/07/2016 7:40 AM Performed by: Mervyn Gay Pre-anesthesia Checklist: Patient identified, Patient being monitored, Timeout performed, Emergency Drugs available and Suction available Patient Re-evaluated:Patient Re-evaluated prior to inductionOxygen Delivery Method: Circle System Utilized Preoxygenation: Pre-oxygenation with 100% oxygen Intubation Type: IV induction Ventilation: Mask ventilation without difficulty Laryngoscope Size: Miller and 3 Grade View: Grade I Tube type: Oral Tube size: 7.5 mm Number of attempts: 1 Airway Equipment and Method: Stylet Placement Confirmation: ETT inserted through vocal cords under direct vision,  positive ETCO2 and breath sounds checked- equal and bilateral Secured at: 22 cm Tube secured with: Tape Dental Injury: Teeth and Oropharynx as per pre-operative assessment

## 2016-09-07 NOTE — Evaluation (Signed)
Physical Therapy Evaluation Patient Details Name: Patrick [name] MRN: [id number] DOB: 08/04/1965 Today's Date: 09/07/2016   History of Present Illness  Pt is s/p elective L THA; posterior hip precautions. PMH includes HTN, bipolar disorder, and tobacco user.   Clinical Impression  Pt is s/p elective surgery above with deficits below. PTA, pt reports his pain in his L hip was very significant and had to use a cane for ambulation. Upon evaluation, pt reporting pain is minimal, but still demonstrates weakness in L hip with ambulation. Received page from RN that pt was not waiting for PT or assist to walk. Pt ambulating in hallway with mother upon start of eval. Educated about importance of having staff member with him when he is up and moving. RN in room and educated as well. Pt agreed to call before getting up. Overall pt at supervision with ambulation for short distances. Will need to follow up with LE exercise program during next session to ensure appropriate technique. Pt reporting he is planning on refusing HHPT secondary to inability to pay. Instructed to discuss with MD. Pt reports he has all necessary equipment at home. Will continue to follow acutely to maximize functional mobility independence.      Follow Up Recommendations DC plan and follow up therapy as arranged by surgeon;Supervision - Intermittent    Equipment Recommendations  None recommended by PT    Recommendations for Other Services OT consult (to ensure able to maintain precautions during ADLs)     Precautions / Restrictions Precautions Precautions: Posterior Hip Precaution Booklet Issued: Yes (comment) Precaution Comments: Pt recalled posterior hip precautions without review, however, administered hip precaution handout for home. Reviewed supine ther ex with pt, however, pt already up in chair and just wanted to discuss.  Restrictions Weight Bearing Restrictions: Yes LLE Weight Bearing: Weight bearing as tolerated       Mobility  Bed Mobility               General bed mobility comments: Walking in hallway with mother at start of eval   Transfers Overall transfer level: Needs assistance Equipment used: Rolling walker (2 wheeled) Transfers: Sit to/from Stand Sit to Stand: Supervision         General transfer comment: Supervision for safety. Demonstrated good technique.   Ambulation/Gait Ambulation/Gait assistance: Supervision Ambulation Distance (Feet): 100 Feet Assistive device: Rolling walker (2 wheeled) Gait Pattern/deviations: Step-to pattern;Decreased weight shift to right;Antalgic Gait velocity: Decreased Gait velocity interpretation: Below normal speed for age/gender General Gait Details: Antalgic gait secondary to post op pain and weakness. Pt ambulating in hallway with mother at start of eval. Educated importance of having staff member with him whenever he is up. Pt demonstrated good step length, and overall steady. Safe use of RW.   Stairs            Wheelchair Mobility    Modified Rankin (Stroke Patients Only)       Balance Overall balance assessment: Needs assistance Sitting-balance support: No upper extremity supported;Feet supported Sitting balance-Leahy Scale: Good     Standing balance support: During functional activity;Bilateral upper extremity supported;No upper extremity supported Standing balance-Leahy Scale: Fair Standing balance comment: able to maintain static standing without use of RW                             Pertinent Vitals/Pain Pain Assessment: 0-10 Pain Score: 1  Pain Location: L hip  Pain Descriptors / Indicators: Aching;Operative site  guarding;Sore Pain Intervention(s): Limited activity within patient's tolerance;Monitored during session;Repositioned    Home Living Family/patient expects to be discharged to:: Private residence Living Arrangements: Parent Available Help at Discharge: Family;Available 24 hours/day Type  of Home: House Home Access: Ramped entrance     Home Layout: One level Home Equipment: Cane - single point;Walker - 2 wheels;Bedside commode      Prior Function Level of Independence: Independent with assistive device(s)         Comments: Using cane secondary to the pain      Hand Dominance   Dominant Hand: Right    Extremity/Trunk Assessment   Upper Extremity Assessment Upper Extremity Assessment: Defer to OT evaluation    Lower Extremity Assessment Lower Extremity Assessment: LLE deficits/detail LLE Deficits / Details: Deficits consistent with post op pain and weakness. Sensory in tact. Pt able to ambulation 100' this session.     Cervical / Trunk Assessment Cervical / Trunk Assessment: Normal  Communication   Communication: No difficulties  Cognition Arousal/Alertness: Awake/alert Behavior During Therapy: WFL for tasks assessed/performed Overall Cognitive Status: Within Functional Limits for tasks assessed                                        General Comments General comments (skin integrity, edema, etc.): Pt reporting he does not want HHPT services at d/c secondary to inability to pay. Educated to discuss with MD about concerns. Reviewed supine ther ex and how to maintain precautions. Pt not wanting to perform secondary to being into chair, but wanting to discuss details of each exercise. Will need to follow up during next session to ensure appropriate technique. Pt educated by RN and PT to always have assist when up in room and in hallway. Pt reported that he would call before getting up.     Exercises     Assessment/Plan    PT Assessment Patient needs continued PT services  PT Problem List Decreased strength;Decreased range of motion;Decreased balance;Decreased mobility;Decreased knowledge of use of DME;Decreased safety awareness;Decreased knowledge of precautions       PT Treatment Interventions DME instruction;Gait training;Functional  mobility training;Therapeutic activities;Therapeutic exercise;Balance training;Neuromuscular re-education;Patient/family education    PT Goals (Current goals can be found in the Care Plan section)  Acute Rehab PT Goals Patient Stated Goal: to go home  PT Goal Formulation: With patient Time For Goal Achievement: 09/14/16 Potential to Achieve Goals: Good    Frequency 7X/week   Barriers to discharge        Co-evaluation               AM-PAC PT "6 Clicks" Daily Activity  Outcome Measure Difficulty turning over in bed (including adjusting bedclothes, sheets and blankets)?: None Difficulty moving from lying on back to sitting on the side of the bed? : None Difficulty sitting down on and standing up from a chair with arms (e.g., wheelchair, bedside commode, etc,.)?: None Help needed moving to and from a bed to chair (including a wheelchair)?: None Help needed walking in hospital room?: None Help needed climbing 3-5 steps with a railing? : A Little 6 Click Score: 23    End of Session Equipment Utilized During Treatment: Gait belt;Left knee immobilizer Activity Tolerance: Patient tolerated treatment well Patient left: in chair;with call bell/phone within reach;with family/visitor present Nurse Communication: Mobility status PT Visit Diagnosis: Other abnormalities of gait and mobility (R26.89)    Time: 3329-5188  PT Time Calculation (min) (ACUTE ONLY): 36 min   Charges:   PT Evaluation $PT Eval Low Complexity: 1 Procedure PT Treatments $Gait Training: 8-22 mins   PT G Codes:        Nicky Pugh, PT, DPT  Acute Rehabilitation Services  Pager: [phone]   Army Melia 09/07/2016, 3:37 PM

## 2016-09-07 NOTE — OR Nursing (Signed)
1010: in&out cath=850cc clear amber urine per protocol

## 2016-09-07 NOTE — Transfer of Care (Signed)
Immediate Anesthesia Transfer of Care Note  Patient: Patrick [name]  Procedure(s) Performed: Procedure(s): TOTAL HIP ARTHROPLASTY (Left)  Patient Location: PACU  Anesthesia Type:General  Level of Consciousness: awake, alert , oriented and patient cooperative  Airway & Oxygen Therapy: Patient Spontanous Breathing and Patient connected to nasal cannula oxygen  Post-op Assessment: Report given to RN, Post -op Vital signs reviewed and stable and Patient moving all extremities X 4  Post vital signs: Reviewed and stable  Last Vitals:  Vitals:   09/07/16 0631 09/07/16 1017  BP: (!) 164/108   Pulse: 81   Resp: 20   Temp:  (P) 36.4 C    Last Pain:  Vitals:   09/07/16 0627  PainSc: 8          Complications: No apparent anesthesia complications

## 2016-09-07 NOTE — Op Note (Signed)
NAME:  Patrick [name], Patrick [name] NO.:  [id number]  MEDICAL RECORD NO.:  [id number]  LOCATION:                                 FACILITY:  PHYSICIAN:  Lockie Pares, M.D.         DATE OF BIRTH:  DATE OF PROCEDURE:  09/07/2016 DATE OF DISCHARGE:                              OPERATIVE REPORT   PREOPERATIVE DIAGNOSIS:  Severe osteoarthritis of left hip with avascular necrosis.  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  TITLE OF PROCEDURE:  Left total hip (AML 12.0, small-statured stem with 36 mm +12 mm neck length ceramic femoral head, 54 mm acetabular Gription cup with ALTRX polyethylene acetabular liner +4 degree, 10 degree liner).  SURGEON:  Lockie Pares, M.D.  ASSISTANTMancel Bale PA.  ANESTHESIA:  General anesthetic.  BLOOD LOSS:  1000 mL.  DESCRIPTION OF PROCEDURE:  In lateral position the posterior approach to the hip made.  The patient had a significant amount of friable capsule leading to the need for an extensive capsular synovectomy.  We split the external rotators and T capsulotomy was made in the hip.  Severe deformity of the femoral head was noted with superior migration with erosion of the acetabulum, significant erosion posterior superiorly.  We made a very conservative cut on the femoral neck due to deformity of the head and the short neck length.  We then progressively broached and reamed up to 11.5 mm broach.  Attention was next directed to the acetabulum.  Acetabular retractors were placed anteriorly and inferiorly.  Extensive resection of the labrum with capsule was necessary due to thickened fibrotic and hemorrhagic capsule.  We then progressively reamed.  Once we medialized the acetabulum, placing the cup superiorly and posteriorly.  We did a 1 mm under reaming to accept a 54 mm cup.  Final cup was placed.  There was some uncovered superiorly due to the deformity noted above.  Elected to put one 25  mm screw with good purchase into the iliac bone.  Trial liner was placed as well as the trial broach.  We then trialed, range of neck lengths, and settled on the +12 mm neck length which was necessary due to the superior migration of the head and moderate shortening. Tried it and we came close if not, I think, more successful relatively restoring leg lengths.  Final components were inserted after copious irrigation.  The 12 mm small-statured AML stem prior to this with the permanent cup, then trialed off the stem and settled on the 12+ 12 mm neck length.  Copious irrigation was used throughout the case.  We infiltrated the subcutaneous tissues with a mixture of Marcaine and Exparel.  Closure was affected with a running #1 Ethibond on the fascia and then 0 Vicryl and 2-0 Vicryl, skin clips in the skin, placed in a lightly compressive sterile dressing, knee immobilizer.  Taken to recovery room in stable condition.     Lockie Pares, M.D.   ______________________________ Lockie Pares, M.D.    WDC/MEDQ  D:  09/07/2016  T:  09/07/2016  Job:  [id number]

## 2016-09-07 NOTE — Care Management Note (Signed)
Case Management Note  Patient Details  Name: Patrick [name] MRN: [id number] Date of Birth: 04/11/1965  Subjective/Objective:    51 yr old male s/p left total hip arthroplasty.                Action/Plan: Case manager spoke with patient concerning discharge plan and DME needs, Patient states that he is not going to do HHPT and doesn't need equipment. He said that he informed Dr. Alvester Morin office that he could not afford co-pay for therapy. He will use [facility] [employer] that he has at home. Case manager did send message to Dr. Alvester Morin office. Patient's mother will assist although he says he is not in need of assistance.   Expected Discharge Date:   09/08/16               Expected Discharge Plan:  Home/Self Care  In-House Referral:  NA  Discharge planning Services  CM Consult  Post Acute Care Choice:  NA Choice offered to:  Patient  DME Arranged:  N/A DME Agency:  NA  HH Arranged:  NA HH Agency:  NA  Status of Service:  Completed, signed off  If discussed at [place] of Stay Meetings, dates discussed:    Additional Comments:  Ninfa Meeker, RN 09/07/2016, 2:46 PM

## 2016-09-07 NOTE — H&P (View-Only) (Signed)
TOTAL HIP ADMISSION H&P  Patient is admitted for left total hip arthroplasty.  Subjective:  Chief Complaint: left hip pain  HPI: Patrick [name], 51 y.o. male, has a history of pain and functional disability in the left hip(s) due to arthritis and secondary to AVN and collapse and patient has failed non-surgical conservative treatments for greater than 12 weeks to include NSAID's and/or analgesics, use of assistive devices, weight reduction as appropriate and activity modification.  Onset of symptoms was gradual starting >10 years ago with gradually worsening course since that time.The patient noted no past surgery on the left hip(s).  Patient currently rates pain in the left hip at 10 out of 10 with activity. Patient has night pain, worsening of pain with activity and weight bearing, trendelenberg gait, pain that interfers with activities of daily living, pain with passive range of motion, crepitus and joint swelling. Patient has evidence of periarticular osteophytes, joint space narrowing and total loss of [place] femoral head with superior migration into acetabulum  by imaging studies. This condition presents safety issues increasing the risk of falls. There is no current active infection.  Patient Active Problem List   Diagnosis Date Noted  . Obesity 02/23/2014  . Impaired glucose tolerance 02/23/2014  . Influenza 05/05/2012  . Chest pain 08/29/2010  . DEHYDRATION 11/24/2009  . TOBACCO USER 11/24/2009  . GASTROENTERITIS, ACUTE 11/24/2009  . ABDOMINAL PAIN, EPIGASTRIC 11/24/2009  . BENIGN PROSTATIC HYPERTROPHY, MILD, HX OF 10/28/2009  . URI 06/01/2008  . Essential hypertension 05/25/2008  . BIPOLAR DISORDER UNSPECIFIED 05/02/2007  . RESTLESS LEG SYNDROME 10/21/2006  . GERD 10/21/2006   Past Medical History:  Diagnosis Date  . Arthritis   . Blood transfusion without reported diagnosis 1984   20+ units  . RLS (restless legs syndrome)   . Tobacco use     Past Surgical History:   Procedure Laterality Date  . arm surgery  1984   MVI  . CARDIAC SURGERY  1984   MVI  . CHOLECYSTECTOMY    . HIP SURGERY  1984   MVI  . KIDNEY SURGERY  1984   MVI  . MANDIBLE FRACTURE SURGERY  1984   MVI     (Not in a hospital admission) Allergies  Allergen Reactions  . Banana Nausea And Vomiting    Social History  Substance Use Topics  . Smoking status: Current Every Day Smoker    Packs/day: 1.00    Years: 35.00    Types: Cigarettes  . Smokeless tobacco: Never Used     Comment: cut back from 1 pack to 1/2 pack day - 35x years  . Alcohol use 0.0 oz/week     Comment: twice a month    Family History  Problem Relation Age of Onset  . Leukemia Father 45       Due to agent orange, deceased  . Hearing loss Mother        Living  . Diabetes Brother   . Obesity Brother   . Healthy Brother   . Alcohol abuse Sister   . Colon cancer Neg Hx      Review of Systems  Constitutional: Positive for weight loss.  Musculoskeletal: Positive for joint pain.  All other systems reviewed and are negative.   Objective:  Physical Exam  Constitutional: He is oriented to person, place, and time. He appears well-developed and well-nourished. No distress.  HENT:  Head: Normocephalic and atraumatic.  Nose: Nose normal.  Eyes: Conjunctivae and EOM are normal. Pupils are  equal, round, and reactive to light.  Neck: Normal range of motion. Neck supple.  Cardiovascular: Normal rate, regular rhythm, normal heart sounds and intact distal pulses.   Respiratory: Effort normal and breath sounds normal. No respiratory distress.  GI: Soft. Bowel sounds are normal. He exhibits no distension. There is no tenderness.  Musculoskeletal:       Left hip: He exhibits decreased range of motion, decreased strength, tenderness and bony tenderness.  Neurological: He is alert and oriented to person, place, and time. No cranial nerve deficit.  Skin: Skin is warm and dry. No rash noted. No erythema.   Psychiatric: He has a normal mood and affect. His behavior is normal.    Vital signs in last 24 hours: BP wrist 186/116, arm 154/89 HR 80 RR 18  Labs:   Estimated body mass index is 33.27 kg/m as calculated from the following:   Height as of 08/08/16: 5' 8.5" (1.74 m).   Weight as of 08/08/16: 100.7 kg (222 lb 1 oz).   Imaging Review Plain radiographs demonstrate severe degenerative joint disease of the left hip(s). The bone quality appears to be good for age and reported activity level.  Assessment/Plan:  End stage arthritis, left hip(s)  The patient history, physical examination, clinical judgement of the provider and imaging studies are consistent with end stage degenerative joint disease of the left hip(s) and total hip arthroplasty is deemed medically necessary. The treatment options including medical management, injection therapy, arthroscopy and arthroplasty were discussed at length. The risks and benefits of total hip arthroplasty were presented and reviewed. The risks due to aseptic loosening, infection, stiffness, dislocation/subluxation,  thromboembolic complications and other imponderables were discussed.  The patient acknowledged the explanation, agreed to proceed with the plan and consent was signed. Patient is being admitted for inpatient treatment for surgery, pain control, PT, OT, prophylactic antibiotics, VTE prophylaxis, progressive ambulation and ADL's and discharge planning.The patient is planning to be discharged home with home health services

## 2016-09-07 NOTE — Anesthesia Postprocedure Evaluation (Signed)
Anesthesia Post Note  Patient: Patrick [name]  Procedure(s) Performed: Procedure(s) (LRB): TOTAL HIP ARTHROPLASTY (Left)     Patient location during evaluation: PACU Anesthesia Type: General Level of consciousness: awake and alert Pain management: pain level controlled Vital Signs Assessment: post-procedure vital signs reviewed and stable Respiratory status: spontaneous breathing, nonlabored ventilation, respiratory function stable and patient connected to nasal cannula oxygen Cardiovascular status: blood pressure returned to baseline and stable Postop Assessment: no signs of nausea or vomiting Anesthetic complications: no    Last Vitals:  Vitals:   09/07/16 1049 09/07/16 1105  BP: (!) 163/96 (!) 139/112  Pulse: 74 81  Resp: 15 19  Temp:      Last Pain:  Vitals:   09/07/16 1045  PainSc: Asleep                 Effie Berkshire

## 2016-09-07 NOTE — Progress Notes (Signed)
Patient refuses to not walk unassisted.  Patient educated.  Patient currently walking unassisted in the hallway with his mother and friend.  Will make MD aware.

## 2016-09-08 LAB — BASIC METABOLIC PANEL
ANION GAP: 8 (ref 5–15)
BUN: 15 mg/dL (ref 6–20)
CALCIUM: 8.2 mg/dL — AB (ref 8.9–10.3)
CHLORIDE: 99 mmol/L — AB (ref 101–111)
CO2: 27 mmol/L (ref 22–32)
Creatinine, Ser: 0.87 mg/dL (ref 0.61–1.24)
GFR calc Af Amer: >60 mL/min (ref >60)
GFR calc non Af Amer: >60 mL/min (ref >60)
Glucose, Bld: 129 mg/dL — ABNORMAL HIGH (ref 65–99)
Potassium: 3.8 mmol/L (ref 3.5–5.1)
SODIUM: 134 mmol/L — AB (ref 135–145)

## 2016-09-08 LAB — CBC
HCT: 28.9 % — ABNORMAL LOW (ref 39.0–52.0)
Hemoglobin: 9.1 g/dL — ABNORMAL LOW (ref 13.0–17.0)
MCH: 30.2 pg (ref 26.0–34.0)
MCHC: 31.5 g/dL (ref 30.0–36.0)
MCV: 96 fL (ref 78.0–100.0)
PLATELETS: 185 10*3/uL (ref 150–400)
RBC: 3.01 MIL/uL — AB (ref 4.22–5.81)
RDW: 13 % (ref 11.5–15.5)
WBC: 10.8 10*3/uL — ABNORMAL HIGH (ref 4.0–10.5)

## 2016-09-08 MED ORDER — ASPIRIN 325 MG PO TABS
325.0000 mg | ORAL_TABLET | Freq: Two times a day (BID) | ORAL | 0 refills | Status: DC
Start: 2016-09-08 — End: 2018-10-07

## 2016-09-08 NOTE — Progress Notes (Signed)
SPORTS MEDICINE AND JOINT REPLACEMENT  Lara Mulch, MD    Carlyon Shadow, PA-C [place], [place], [place]  [address]                             [phone]   PROGRESS NOTE  Subjective:  negative for Chest Pain  negative for Shortness of Breath  negative for Nausea/Vomiting   negative for Calf Pain  negative for Bowel Movement   Tolerating Diet: yes         Patient reports pain as 1 on 0-10 scale.    Objective: Vital signs in last 24 hours:   Patient Vitals for the past 24 hrs:  BP Temp Temp src Pulse Resp SpO2  09/08/16 0500 124/73 98.5 F (36.9 C) Oral 83 18 97 %  09/07/16 2239 135/77 99.8 F (37.7 C) Oral 99 18 96 %  09/07/16 2034 126/78 99.7 F (37.6 C) Oral (!) 103 18 96 %  09/07/16 1130 (!) 150/94 98 F (36.7 C) Oral 80 19 98 %  09/07/16 1105 (!) 139/112 - - 81 19 99 %  09/07/16 1049 (!) 163/96 - - 74 15 100 %  09/07/16 1045 - - - 74 18 99 %  09/07/16 1034 (!) 169/95 - - 83 16 100 %  09/07/16 1029 - - - 78 17 98 %  09/07/16 1019 (!) 156/101 - - 81 - 100 %  09/07/16 1017 - 97.6 F (36.4 C) - 82 (!) 22 100 %    @flow {1959:LAST@   Intake/Output from previous day:   06/01 0701 - 06/02 0700 In: 5787.5 [P.O.:1770; I.V.:3417.5] Out: 2100 [Urine:850]   Intake/Output this shift:   No intake/output data recorded.   Intake/Output      06/01 0701 - 06/02 0700 06/02 0701 - 06/03 0700   P.O. 1770    I.V. 3417.5    IV Piggyback 600    Total Intake 5787.5     Urine 850    Blood 1250    Total Output 2100     Net +3687.5          Urine Occurrence 1 x       LABORATORY DATA:  Recent Labs  09/07/16 1234  HGB 11.7*  HCT 36.8*   No results for input(s): NA, K, CL, CO2, BUN, CREATININE, GLUCOSE, CALCIUM in the last 168 hours. Lab Results  Component Value Date   INR 1.06 08/27/2016    Examination:  General appearance: alert, cooperative and no distress Extremities: extremities normal, atraumatic, no cyanosis or edema  Wound Exam: clean,  dry, intact   Drainage:  None: wound tissue dry  Motor Exam: Quadriceps and Hamstrings Intact  Sensory Exam: Superficial Peroneal, Deep Peroneal and Tibial normal   Assessment:    1 Day Post-Op  Procedure(s) (LRB): TOTAL HIP ARTHROPLASTY (Left)  ADDITIONAL DIAGNOSIS:  Active Problems:   Status post total hip replacement, left    Plan: Physical Therapy as ordered Weight Bearing as Tolerated (WBAT)  DVT Prophylaxis:  Aspirin  DISCHARGE PLAN: Home  DISCHARGE NEEDS: HHPT   Patient doing very well, will D/C home today         Donia Ast 09/08/2016, 7:08 AM

## 2016-09-08 NOTE — Progress Notes (Signed)
Patient to be discharged. Discharge instructions and prescriptions reviewed with patient. Patient stated understanding, IV removed. Patient is waiting on transportation

## 2016-09-08 NOTE — Discharge Summary (Signed)
SPORTS MEDICINE & JOINT REPLACEMENT   Lara Mulch, MD   Carlyon Shadow, PA-C [address], [place], [place]  [address]                             [phone]  PATIENT ID: Patrick [name]        MRN:  [id number]          DOB/AGE: November 29, 1965 / 51 y.o.    DISCHARGE SUMMARY  ADMISSION DATE:    09/07/2016 DISCHARGE DATE:   09/08/2016   ADMISSION DIAGNOSIS: OA LEFT HIP    DISCHARGE DIAGNOSIS:  OSTEOARTHRITIS LEFT HIP    ADDITIONAL DIAGNOSIS: Active Problems:   Status post total hip replacement, left  Past Medical History:  Diagnosis Date  . Arthritis   . Blood transfusion without reported diagnosis 1984   20+ units  . RLS (restless legs syndrome)   . Tobacco use     PROCEDURE: Procedure(s): TOTAL HIP ARTHROPLASTY on 09/07/2016  CONSULTS:    HISTORY:  See H&P in chart  HOSPITAL COURSE:  Patrick [name] is a 51 y.o. admitted on 09/07/2016 and found to have a diagnosis of [place].  After appropriate laboratory studies were obtained  they were taken to the operating room on 09/07/2016 and underwent Procedure(s): TOTAL HIP ARTHROPLASTY.   They were given perioperative antibiotics:  Anti-infectives    Start     Dose/Rate Route Frequency Ordered Stop   09/07/16 1400  ceFAZolin (ANCEF) IVPB 2g/100 mL premix     2 g 200 mL/hr over 30 Minutes Intravenous Every 6 hours 09/07/16 1131 09/07/16 2304   09/07/16 0700  ceFAZolin (ANCEF) IVPB 2g/100 mL premix  Status:  Discontinued     2 g 200 mL/hr over 30 Minutes Intravenous To ShortStay Surgical 09/06/16 0858 09/07/16 1130    .  Patient given tranexamic acid IV or topical and exparel intra-operatively.  Tolerated the procedure well.    POD# 1: Vital signs were stable.  Patient denied Chest pain, shortness of breath, or calf pain.  Patient was started on Lovenox 30 mg subcutaneously twice daily at 8am.  Consults to PT, OT, and care management were made.  The patient was weight bearing as tolerated.  CPM was placed on the  operative leg 0-90 degrees for 6-8 hours a day. When out of the CPM, patient was placed in the foam block to achieve full extension. Incentive spirometry was taught.  Dressing was changed.       POD #2, Continued  PT for ambulation and exercise program.  IV saline locked.  O2 discontinued.    The remainder of the hospital course was dedicated to ambulation and strengthening.   The patient was discharged on 1 Day Post-Op in  Good condition.  Blood products given:none  DIAGNOSTIC STUDIES: Recent vital signs: Patient Vitals for the past 24 hrs:  BP Temp Temp src Pulse Resp SpO2  09/08/16 0500 124/73 98.5 F (36.9 C) Oral 83 18 97 %  09/07/16 2239 135/77 99.8 F (37.7 C) Oral 99 18 96 %  09/07/16 2034 126/78 99.7 F (37.6 C) Oral (!) 103 18 96 %  09/07/16 1130 (!) 150/94 98 F (36.7 C) Oral 80 19 98 %  09/07/16 1105 (!) 139/112 - - 81 19 99 %  09/07/16 1049 (!) 163/96 - - 74 15 100 %  09/07/16 1045 - - - 74 18 99 %  09/07/16 1034 (!) 169/95 - -  83 16 100 %  09/07/16 1029 - - - 78 17 98 %  09/07/16 1019 (!) 156/101 - - 81 - 100 %  09/07/16 1017 - 97.6 F (36.4 C) - 82 (!) 22 100 %       Recent laboratory studies:  Recent Labs  09/07/16 1234  HGB 11.7*  HCT 36.8*   No results for input(s): NA, K, CL, CO2, BUN, CREATININE, GLUCOSE, CALCIUM in the last 168 hours. Lab Results  Component Value Date   INR 1.06 08/27/2016     Recent Radiographic Studies :  Dg Chest 2 View  Result Date: 08/27/2016 CLINICAL DATA:  Degenerative joint disease of the hip. Preadmission. EXAM: CHEST  2 VIEW COMPARISON:  None. FINDINGS: Normal heart size. Aortic tortuosity. Postoperative mediastinum. There is no edema, consolidation, or pneumothorax. Blunting of the lateral left costophrenic sulcus is likely postoperative scarring. No effusion seen in the posterior costophrenic sulci. No acute osseous findings. IMPRESSION: Postoperative chest.  No evidence of active disease. Electronically Signed   By:  Monte Fantasia M.D.   On: 08/27/2016 13:51   Dg Hip Port Unilat With Pelvis 1v Left  Result Date: 09/07/2016 CLINICAL DATA:  Status post left total hip arthroplasty. EXAM: DG HIP (WITH OR WITHOUT PELVIS) 1V PORT LEFT COMPARISON:  None. FINDINGS: The femoral and acetabular components appear to be well situated. Expected postoperative changes are seen in the surrounding soft tissues. No fracture or dislocation is noted. IMPRESSION: Status post left total hip arthroplasty. Electronically Signed   By: Marijo Conception, M.D.   On: 09/07/2016 11:54    DISCHARGE INSTRUCTIONS: Discharge Instructions    Call MD / Call 911    Complete by:  As directed    If you experience chest pain or shortness of breath, CALL 911 and be transported to the hospital emergency room.  If you develope a fever above 101 F, pus (white drainage) or increased drainage or redness at the wound, or calf pain, call your surgeon's office.   Constipation Prevention    Complete by:  As directed    Drink plenty of fluids.  Prune juice may be helpful.  You may use a stool softener, such as Colace (over the counter) 100 mg twice a day.  Use MiraLax (over the counter) for constipation as needed.   Diet - low sodium heart healthy    Complete by:  As directed    Discharge instructions    Complete by:  As directed    INSTRUCTIONS AFTER JOINT REPLACEMENT   Remove items at home which could result in a fall. This includes throw rugs or furniture in walking pathways ICE to the affected joint every three hours while awake for 30 minutes at a time, for at least the first 3-5 days, and then as needed for pain and swelling.  Continue to use ice for pain and swelling. You may notice swelling that will progress down to the foot and ankle.  This is normal after surgery.  Elevate your leg when you are not up walking on it.   Continue to use the breathing machine you got in the hospital (incentive spirometer) which will help keep your temperature down.   It is common for your temperature to cycle up and down following surgery, especially at night when you are not up moving around and exerting yourself.  The breathing machine keeps your lungs expanded and your temperature down.   DIET:  As you were doing prior to hospitalization, we recommend  a well-balanced diet.  DRESSING / WOUND CARE / SHOWERING  Keep the surgical dressing until follow up.  The dressing is water proof, so you can shower without any extra covering.  IF THE DRESSING FALLS OFF or the wound gets wet inside, change the dressing with sterile gauze.  Please use good hand washing techniques before changing the dressing.  Do not use any lotions or creams on the incision until instructed by your surgeon.    ACTIVITY  Increase activity slowly as tolerated, but follow the weight bearing instructions below.   No driving for 6 weeks or until further direction given by your physician.  You cannot drive while taking narcotics.  No lifting or carrying greater than 10 lbs. until further directed by your surgeon. Avoid periods of inactivity such as sitting longer than an hour when not asleep. This helps prevent blood clots.  You may return to work once you are authorized by your doctor.     WEIGHT BEARING   Weight bearing as tolerated with assist device (walker, cane, etc) as directed, use it as long as suggested by your surgeon or therapist, typically at least 4-6 weeks.   EXERCISES  Results after joint replacement surgery are often greatly improved when you follow the exercise, range of motion and muscle strengthening exercises prescribed by your doctor. Safety measures are also important to protect the joint from further injury. Any time any of these exercises cause you to have increased pain or swelling, decrease what you are doing until you are comfortable again and then slowly increase them. If you have problems or questions, call your caregiver or physical therapist for advice.    Rehabilitation is important following a joint replacement. After just a few days of immobilization, the muscles of the leg can become weakened and shrink (atrophy).  These exercises are designed to build up the tone and strength of the thigh and leg muscles and to improve motion. Often times heat used for twenty to thirty minutes before working out will loosen up your tissues and help with improving the range of motion but do not use heat for the first two weeks following surgery (sometimes heat can increase post-operative swelling).   These exercises can be done on a training (exercise) mat, on the floor, on a table or on a bed. Use whatever works the best and is most comfortable for you.    Use music or television while you are exercising so that the exercises are a pleasant break in your day. This will make your life better with the exercises acting as a break in your routine that you can look forward to.   Perform all exercises about fifteen times, three times per day or as directed.  You should exercise both the operative leg and the other leg as well.   Exercises include:   Quad Sets - Tighten up the muscle on the front of the thigh (Quad) and hold for 5-10 seconds.   Straight Leg Raises - With your knee straight (if you were given a brace, keep it on), lift the leg to 60 degrees, hold for 3 seconds, and slowly lower the leg.  Perform this exercise against resistance later as your leg gets stronger.  Leg Slides: Lying on your back, slowly slide your foot toward your buttocks, bending your knee up off the floor (only go as far as is comfortable). Then slowly slide your foot back down until your leg is flat on the floor again.  Angel Wings:  Lying on your back spread your legs to the side as far apart as you can without causing discomfort.  Hamstring Strength:  Lying on your back, push your heel against the floor with your leg straight by tightening up the muscles of your buttocks.  Repeat, but this  time bend your knee to a comfortable angle, and push your heel against the floor.  You may put a pillow under the heel to make it more comfortable if necessary.   A rehabilitation program following joint replacement surgery can speed recovery and prevent re-injury in the future due to weakened muscles. Contact your doctor or a physical therapist for more information on knee rehabilitation.    CONSTIPATION  Constipation is defined medically as fewer than three stools per week and severe constipation as less than one stool per week.  Even if you have a regular bowel pattern at home, your normal regimen is likely to be disrupted due to multiple reasons following surgery.  Combination of anesthesia, postoperative narcotics, change in appetite and fluid intake all can affect your bowels.   YOU MUST use at least one of the following options; they are listed in order of increasing strength to get the job done.  They are all available over the counter, and you may need to use some, POSSIBLY even all of these options:    Drink plenty of fluids (prune juice may be helpful) and high fiber foods Colace 100 mg by mouth twice a day  Senokot for constipation as directed and as needed Dulcolax (bisacodyl), take with full glass of water  Miralax (polyethylene glycol) once or twice a day as needed.  If you have tried all these things and are unable to have a bowel movement in the first 3-4 days after surgery call either your surgeon or your primary doctor.    If you experience loose stools or diarrhea, hold the medications until you stool forms back up.  If your symptoms do not get better within 1 week or if they get worse, check with your doctor.  If you experience "the worst abdominal pain ever" or develop nausea or vomiting, please contact the office immediately for further recommendations for treatment.   ITCHING:  If you experience itching with your medications, try taking only a single pain pill, or even  half a pain pill at a time.  You can also use Benadryl over the counter for itching or also to help with sleep.   TED HOSE STOCKINGS:  Use stockings on both legs until for at least 2 weeks or as directed by physician office. They may be removed at night for sleeping.  MEDICATIONS:  See your medication summary on the "After Visit Summary" that nursing will review with you.  You may have some home medications which will be placed on hold until you complete the course of blood thinner medication.  It is important for you to complete the blood thinner medication as prescribed.  PRECAUTIONS:  If you experience chest pain or shortness of breath - call 911 immediately for transfer to the hospital emergency department.   If you develop a fever greater that 101 F, purulent drainage from wound, increased redness or drainage from wound, foul odor from the wound/dressing, or calf pain - CONTACT YOUR SURGEON.  FOLLOW-UP APPOINTMENTS:  If you do not already have a post-op appointment, please call the office for an appointment to be seen by your surgeon.  Guidelines for how soon to be seen are listed in your "After Visit Summary", but are typically between 1-4 weeks after surgery.  OTHER INSTRUCTIONS:   Knee Replacement:  Do not place pillow under knee, focus on keeping the knee straight while resting. CPM instructions: 0-90 degrees, 2 hours in the morning, 2 hours in the afternoon, and 2 hours in the evening. Place foam block, curve side up under heel at all times except when in CPM or when walking.  DO NOT modify, tear, cut, or change the foam block in any way.  MAKE SURE YOU:  Understand these instructions.  Get help right away if you are not doing well or get worse.    Thank you for letting us be a part of your medical care team.  It is a privilege we respect greatly.  We hope these instructions will help you stay on track for a fast and full recovery!    Increase activity slowly as tolerated    Complete by:  As directed       DISCHARGE MEDICATIONS:   Allergies as of 09/08/2016      Reactions   Banana Nausea And Vomiting      Medication List    STOP taking these medications   ibuprofen 200 MG tablet Commonly known as:  ADVIL,MOTRIN   meloxicam 15 MG tablet Commonly known as:  MOBIC   SALONPAS PAIN RELIEF PATCH EX     TAKE these medications   aspirin 325 MG tablet Take 1 tablet (325 mg total) by mouth 2 (two) times daily.   carbidopa-levodopa 25-100 MG tablet Commonly known as:  SINEMET IR Take 1-1.5 tablets by mouth at bedtime.   carbidopa-levodopa 50-200 MG tablet Commonly known as:  SINEMET CR Take 1 tablet by mouth 2 (two) times daily.   clonazePAM 0.5 MG tablet Commonly known as:  KLONOPIN Take 0.5 mg by mouth at bedtime. What changed:  Another medication with the same name was removed. Continue taking this medication, and follow the directions you see here.   gabapentin 300 MG capsule Commonly known as:  NEURONTIN Take 2 capsules (600 mg total) by mouth at bedtime.   methocarbamol 500 MG tablet Commonly known as:  ROBAXIN Take 500 mg by mouth every 8 (eight) hours as needed for muscle spasms.   oxyCODONE-acetaminophen 5-325 MG tablet Commonly known as:  ROXICET Take 1-2 tablets by mouth every 6 (six) hours as needed for severe pain.   rOPINIRole 1 MG tablet Commonly known as:  REQUIP Take 1 tablet (1 mg total) by mouth at bedtime.            Durable Medical Equipment        Start     Ordered   09/07/16 1132  DME Walker rolling  Once    Question:  Patient needs a walker to treat with the following condition  Answer:  Status post total hip replacement, left   09/07/16 1131   09/07/16 1132  DME 3 n 1  Once     09/07/16 1131      FOLLOW UP VISIT:    DISPOSITION: HOME VS. SNF  CONDITION:  Good   Donia Ast 09/08/2016, 7:12 AM

## 2016-09-08 NOTE — Progress Notes (Signed)
Physical Therapy Treatment Patient Details Name: Patrick [name] MRN: [id number] DOB: 1966/03/01 Today's Date: 09/08/2016    History of Present Illness Pt is s/p elective L THA; posterior hip precautions. PMH includes HTN, bipolar disorder, and tobacco user.     PT Comments    Pt performed gait and stair training in prep for d/c home.  Pt reviewed seated and standing exercises x10 reps. Plan to d/c home with RN in to review d/c instructions post tx.      Follow Up Recommendations  DC plan and follow up therapy as arranged by surgeon;Supervision - Intermittent     Equipment Recommendations  None recommended by PT    Recommendations for Other Services       Precautions / Restrictions Precautions Precautions: Posterior Hip Precaution Booklet Issued: Yes (comment) Precaution Comments: Pt able to recall 3/3 precautions at start of session; reviewed all precautions in detail and in relation to movement.   Restrictions Weight Bearing Restrictions: Yes LLE Weight Bearing: Weight bearing as tolerated    Mobility  Bed Mobility               General bed mobility comments: Pt OOB in chair upon arrival  Transfers Overall transfer level: Modified independent Equipment used: Rolling walker (2 wheeled) Transfers: Sit to/from Stand Sit to Stand: Modified independent (Device/Increase time)         General transfer comment: Good technique.    Ambulation/Gait Ambulation/Gait assistance: Supervision Ambulation Distance (Feet): 200 Feet Assistive device: Rolling walker (2 wheeled) Gait Pattern/deviations: Step-through pattern;Narrow base of support;Antalgic Gait velocity: Decreased Gait velocity interpretation: Below normal speed for age/gender General Gait Details: Cues for L turns to maintain hip precautions.     Stairs            Wheelchair Mobility    Modified Rankin (Stroke Patients Only)       Balance Overall balance assessment: Needs  assistance Sitting-balance support: Feet supported;No upper extremity supported Sitting balance-Leahy Scale: Good     Standing balance support: Bilateral upper extremity supported Standing balance-Leahy Scale: Fair Standing balance comment: able to maintain static standing without use of RW                            Cognition Arousal/Alertness: Awake/alert Behavior During Therapy: WFL for tasks assessed/performed Overall Cognitive Status: Within Functional Limits for tasks assessed                                        Exercises Total Joint Exercises Hip ABduction/ADduction: AROM;Left;10 reps;Standing Long Arc Quad: AROM;Left;10 reps;Seated Knee Flexion: AROM;Left;10 reps;Standing Marching in Standing: AROM;Left;Standing;10 reps Standing Hip Extension: AROM;Left;10 reps;Standing Other Exercises Other Exercises: Reviewed technique for APs, QS, heel slides, hip abduction/adduction, SAQs in reclined/supine position.  pt educated on frequency to perform at home.      General Comments        Pertinent Vitals/Pain Pain Assessment: 0-10 Pain Score: 6  Faces Pain Scale: Hurts little more Pain Location: L quad Pain Descriptors / Indicators: Discomfort;Sore Pain Intervention(s): Monitored during session;Repositioned    Home Living Family/patient expects to be discharged to:: Private residence Living Arrangements: Parent Available Help at Discharge: Family;Available 24 hours/day Type of Home: House Home Access: Ramped entrance   Home Layout: One level Home Equipment: Cane - single point;Walker - standard;Toilet riser;Shower seat - built in  Prior Function Level of Independence: Independent with assistive device(s)      Comments: Using cane secondary to the pain    PT Goals (current goals can now be found in the care plan section) Acute Rehab PT Goals Patient Stated Goal: home today Potential to Achieve Goals: Good Progress towards PT  goals: Progressing toward goals    Frequency    7X/week      PT Plan Current plan remains appropriate    Co-evaluation              AM-PAC PT "6 Clicks" Daily Activity  Outcome Measure  Difficulty turning over in bed (including adjusting bedclothes, sheets and blankets)?: None Difficulty moving from lying on back to sitting on the side of the bed? : None Difficulty sitting down on and standing up from a chair with arms (e.g., wheelchair, bedside commode, etc,.)?: None Help needed moving to and from a bed to chair (including a wheelchair)?: None Help needed walking in hospital room?: A Little Help needed climbing 3-5 steps with a railing? : A Little 6 Click Score: 22    End of Session Equipment Utilized During Treatment: Gait belt Activity Tolerance: Patient tolerated treatment well Patient left: in chair;with call bell/phone within reach;with family/visitor present Nurse Communication: Mobility status PT Visit Diagnosis: Other abnormalities of gait and mobility (R26.89)     Time: 3437-3578 PT Time Calculation (min) (ACUTE ONLY): 28 min  Charges:  $Gait Training: 8-22 mins $Therapeutic Exercise: 8-22 mins                    G Codes:       Governor Rooks, PTA pager [phone]    Cristela Blue 09/08/2016, 10:58 AM

## 2016-09-08 NOTE — Evaluation (Signed)
Occupational Therapy Evaluation and [place] Patient Details Name: Patrick [name] MRN: [id number] DOB: [birth year] Today's Date: 09/08/2016    History of Present Illness Pt is s/p elective L THA; posterior hip precautions. PMH includes HTN, bipolar disorder, and tobacco user.    Clinical Impression   Pt reports he was independent with ADL PTA. Currently pt overall supervision for ADL and functional mobility with the exception of min assist for LB ADL. Educated pt on posterior hip precautions, especially in relation to ADL. Educated on compensatory strategies and use of AE for increased independence; pt verbalized understanding. Pt planning to d/c home with 24/7 supervision from his mother. No further acute OT needs identified; signing off at this time. Please re-consult if needs change. Thank you for this referral.    Follow Up Recommendations  No OT follow up;Supervision - Intermittent    Equipment Recommendations  None recommended by OT    Recommendations for Other Services       Precautions / Restrictions Precautions Precautions: Posterior Hip Precaution Comments: Pt able to recall 2/3 precautions at start of session; reviewed all precautions in detail and in relation to ADL. Restrictions Weight Bearing Restrictions: Yes LLE Weight Bearing: Weight bearing as tolerated      Mobility Bed Mobility               General bed mobility comments: Pt OOB in chair upon arrival  Transfers Overall transfer level: Needs assistance Equipment used: Rolling walker (2 wheeled) Transfers: Sit to/from Stand Sit to Stand: Supervision         General transfer comment: for safety. Good hand placement and technique    Balance Overall balance assessment: Needs assistance Sitting-balance support: Feet supported;No upper extremity supported Sitting balance-Leahy Scale: Good     Standing balance support: Bilateral upper extremity supported Standing balance-Leahy Scale: Fair                             ADL either performed or assessed with clinical judgement   ADL Overall ADL's : Needs assistance/impaired Eating/Feeding: Independent;Sitting   Grooming: Supervision/safety;Standing   Upper Body Bathing: Set up;Sitting   Lower Body Bathing: Minimal assistance;Sit to/from stand Lower Body Bathing Details (indicate cue type and reason): Educated on use of long handled sponge for increased independence with LB bathing Upper Body Dressing : Set up;Sitting   Lower Body Dressing: Minimal assistance;Sit to/from stand Lower Body Dressing Details (indicate cue type and reason): Educated on no bending for LB dressing; pt reports mother can assist with dressing as needed Toilet Transfer: Supervision/safety;Ambulation;RW Toilet Transfer Details (indicate cue type and reason): Simulated by sit to stand from chair with functional mobility     Tub/ Shower Transfer: Supervision/safety;Walk-in shower;Ambulation;Shower Scientist, research (medical) Details (indicate cue type and reason): Educated pt on walk in shower transfer technique with RW; pt able to return demo with supervision Functional mobility during ADLs: Supervision/safety;Rolling walker General ADL Comments: Reviewed posterior hip precautions in detail with pt, espeically in relation to ADL; pt verbalized understanding.     Vision         Perception     Praxis      Pertinent Vitals/Pain Pain Assessment: Faces Faces Pain Scale: Hurts little more Pain Location: L hip, L quad Pain Descriptors / Indicators: Discomfort;Sore Pain Intervention(s): Monitored during session     Hand Dominance Right   Extremity/Trunk Assessment Upper Extremity Assessment Upper Extremity Assessment: Overall WFL for tasks assessed   Lower  Extremity Assessment Lower Extremity Assessment: Defer to PT evaluation   Cervical / Trunk Assessment Cervical / Trunk Assessment: Normal   Communication Communication Communication: No  difficulties   Cognition Arousal/Alertness: Awake/alert Behavior During Therapy: WFL for tasks assessed/performed Overall Cognitive Status: Within Functional Limits for tasks assessed                                     General Comments       Exercises     Shoulder Instructions      Home Living Family/patient expects to be discharged to:: Private residence Living Arrangements: Parent Available Help at Discharge: Family;Available 24 hours/day Type of Home: House Home Access: Ramped entrance     Home Layout: One level     Bathroom Shower/Tub: Occupational psychologist: Standard     Home Equipment: Cane - single point;Walker - standard;Toilet riser;Shower seat - built in          Prior Functioning/Environment Level of Independence: Independent with assistive device(s)        Comments: Using cane secondary to the pain         OT Problem List: Decreased range of motion;Impaired balance (sitting and/or standing);Pain      OT Treatment/Interventions:      OT Goals(Current goals can be found in the care plan section) Acute Rehab OT Goals Patient Stated Goal: home today OT Goal Formulation: All assessment and education complete, DC therapy  OT Frequency:     Barriers to D/C:            Co-evaluation              AM-PAC PT "6 Clicks" Daily Activity     Outcome Measure Help from another person eating meals?: None Help from another person taking care of personal grooming?: A Little Help from another person toileting, which includes using toliet, bedpan, or urinal?: A Little Help from another person bathing (including washing, rinsing, drying)?: A Little Help from another person to put on and taking off regular upper body clothing?: None Help from another person to put on and taking off regular lower body clothing?: A Little 6 Click Score: 20   End of Session Equipment Utilized During Treatment: Rolling walker  Activity  Tolerance: Patient tolerated treatment well Patient left: in chair;with call bell/phone within reach  OT Visit Diagnosis: Other abnormalities of gait and mobility (R26.89);Pain Pain - Right/Left: Left Pain - part of body: Hip                Time: 7619-5093 OT Time Calculation (min): 21 min Charges:  OT General Charges $OT Visit: 1 Procedure OT Evaluation $OT Eval Moderate Complexity: 1 Procedure G-Codes:     Tyianna Menefee A. Ulice Brilliant, M.S., OTR/L Pager: [place] 09/08/2016, 9:51 AM

## 2016-09-08 NOTE — Clinical Social Work Note (Signed)
CSW met with pt at bedside to address consult for SNF placement. P/T rec "DC plan and follow up therapy as arranged by surgeon;Supervision - Intermittent." Discharge summary disposition states SNF vs. Home. Pt states he will discharge home with mom-Renate Kuiken [phone]) who will provide 24/7 supervision. Pt states he does not have the money for HHPT. Pt ready for discharge today and mother to provide transportation when pt is ready. CSW signing off as no further social work needs identified.   Oretha Ellis, [place], [place] Work (Weekend) [phone]

## 2016-09-10 ENCOUNTER — Encounter (HOSPITAL_COMMUNITY): Payer: Self-pay | Admitting: Orthopedic Surgery

## 2016-09-10 LAB — TYPE AND SCREEN
ABO/RH(D): O NEG
ANTIBODY SCREEN: POSITIVE
DAT, IgG: NEGATIVE
DONOR AG TYPE: NEGATIVE
DONOR AG TYPE: NEGATIVE
PT AG Type: NEGATIVE
Unit division: 0
Unit division: 0

## 2016-09-10 LAB — BPAM RBC
Blood Product Expiration Date: 201806132359
Blood Product Expiration Date: 201806132359
ISSUE DATE / TIME: 201806010826
ISSUE DATE / TIME: 201806010826
Unit Type and Rh: 9500
Unit Type and Rh: 9500

## 2016-09-20 ENCOUNTER — Other Ambulatory Visit: Payer: Self-pay | Admitting: Internal Medicine

## 2016-09-21 NOTE — Telephone Encounter (Signed)
Rx phoned in to patient pharmacy.

## 2016-09-21 NOTE — Telephone Encounter (Signed)
Patient was prescribed klonopin 0.5 mg in the hospital on 09/07/16 and is requesting for a refill. Please Advise

## 2016-09-21 NOTE — Telephone Encounter (Signed)
Okay for refill?  

## 2016-11-14 ENCOUNTER — Ambulatory Visit: Payer: Managed Care, Other (non HMO) | Admitting: Neurology

## 2016-11-14 DIAGNOSIS — Z029 Encounter for administrative examinations, unspecified: Secondary | ICD-10-CM

## 2016-11-19 ENCOUNTER — Encounter: Payer: Self-pay | Admitting: Neurology

## 2016-12-27 ENCOUNTER — Encounter: Payer: Self-pay | Admitting: Internal Medicine

## 2017-01-03 ENCOUNTER — Ambulatory Visit (HOSPITAL_COMMUNITY)
Admission: EM | Admit: 2017-01-03 | Discharge: 2017-01-03 | Disposition: A | Discharge status: Other Acute Inpatient Hospital | Payer: Managed Care, Other (non HMO)

## 2017-06-11 ENCOUNTER — Other Ambulatory Visit: Payer: Self-pay | Admitting: Neurology

## 2017-06-19 ENCOUNTER — Telehealth: Payer: Self-pay

## 2017-06-19 NOTE — Telephone Encounter (Signed)
Message from [place] wants to know if they can get a new prescription of Ropinirole 4mg  pill. pt's pharmacy is at [facility] on [address] in [place]. Caller apologies they have been unemployed for some time and thats why they have not been by to see Dr.

## 2017-06-20 MED ORDER — ROPINIROLE HCL 1 MG PO TABS: 1.0000 mg | ORAL_TABLET | Freq: Every day | ORAL | 0 refills | Status: DC

## 2017-06-20 NOTE — Telephone Encounter (Signed)
Medication filled for 30 days. Left message on patient phone to call us back to make an office visit.

## 2017-06-23 ENCOUNTER — Other Ambulatory Visit: Payer: Self-pay | Admitting: Neurology

## 2017-06-28 ENCOUNTER — Telehealth: Payer: Self-pay | Admitting: *Deleted

## 2017-06-28 NOTE — Telephone Encounter (Signed)
Copied from [place]. Topic: General - Other >> Jun 21, 2017 10:47 AM Patrick [name] wrote:  FYI Pt call to follow up on his refill.  Spoke with this patient and let him know that the RX    rOPINIRole (REQUIP) 1 MG tablet   has been called in and before anymore refills he will need an appt. Pt said he has no insurance and can not afford to come to the doctor right now

## 2017-06-28 NOTE — Telephone Encounter (Signed)
Last office visit Dec. 2017. FYI.

## 2017-07-02 ENCOUNTER — Telehealth: Payer: Self-pay | Admitting: Neurology

## 2017-07-02 NOTE — Telephone Encounter (Signed)
Patient called regarding his medication for restless leg syndrome. He has no insurance any longer and he needs a refill on his Requip medication. Please Call. He wondered was there any assistance available for that? Please Call. Thanks

## 2017-07-04 NOTE — Telephone Encounter (Signed)
Called patient back and left message that Dr. Serita Grit last note says to taper off Lyrica and start gabapentin 300 mg qhs.  Requested for him to call pharmacy and request refill if needed.

## 2017-07-12 ENCOUNTER — Other Ambulatory Visit: Payer: Self-pay | Admitting: Neurology

## 2017-08-14 ENCOUNTER — Other Ambulatory Visit: Payer: Self-pay | Admitting: *Deleted

## 2017-08-14 ENCOUNTER — Telehealth: Payer: Self-pay | Admitting: *Deleted

## 2017-08-14 MED ORDER — ROPINIROLE HCL 1 MG PO TABS: ORAL_TABLET | ORAL | 1 refills | Status: DC

## 2017-08-14 NOTE — Telephone Encounter (Signed)
OK to start requip 0.5mg  at bedtime x 1 week, then increase to 1 tablet at bedtime.  Stay on this dose until he sees me again.

## 2017-08-14 NOTE — Telephone Encounter (Signed)
Patient called and said that the gabapentin is not working and he would like to go back to Requip.  He will have insurance in about 30 days and will be able to come back to see [place] after that.  Please advise.

## 2017-08-14 NOTE — Telephone Encounter (Signed)
Rx sent in with note requesting for patient to call and schedule a follow up appointment.

## 2017-08-28 ENCOUNTER — Other Ambulatory Visit: Payer: Self-pay | Admitting: Neurology

## 2017-09-05 ENCOUNTER — Other Ambulatory Visit: Payer: Self-pay | Admitting: Neurology

## 2017-09-07 ENCOUNTER — Other Ambulatory Visit: Payer: Self-pay | Admitting: Neurology

## 2017-09-07 ENCOUNTER — Other Ambulatory Visit: Payer: Self-pay | Admitting: Internal Medicine

## 2017-09-09 NOTE — Telephone Encounter (Signed)
Left message to return phone call. Pt needs to schedule an ov for more refills.

## 2017-09-09 NOTE — Telephone Encounter (Signed)
Pt need to schedule an apt for more refills.

## 2017-09-16 ENCOUNTER — Other Ambulatory Visit: Payer: Self-pay | Admitting: Internal Medicine

## 2017-09-16 NOTE — Telephone Encounter (Signed)
Copied from [place] [id number]. Topic: Quick Communication - Rx Refill/Question >> Sep 16, 2017  4:23 PM Robina Ade, Helene Kelp D wrote: Medication: rOPINIRole (REQUIP) 1 MG tablet  Has the patient contacted their pharmacy? Yes, Pt would like enough meds to last until appt (Agent: If no, request that the patient contact the pharmacy for the refill.) (Agent: If yes, when and what did the pharmacy advise?),  Preferred Pharmacy (with phone number or street name): [facility], [place].  Agent: Please be advised that RX refills may take up to 3 business days. We ask that you follow-up with your pharmacy.

## 2017-09-17 MED ORDER — ROPINIROLE HCL 1 MG PO TABS: 1.0000 mg | ORAL_TABLET | Freq: Every day | ORAL | 0 refills | Status: DC

## 2017-09-17 NOTE — Telephone Encounter (Signed)
Rx refill request of medication filled by outside provider: Requip 1 mg    Last filled: 08/14/17 #30  LOV: 03/21/16  PCP: [place]: Jonna Coup Erling Conte

## 2017-09-17 NOTE — Telephone Encounter (Signed)
Medication filled to pharmacy as requested.  Patient has upcoming appt on 09/20/17.

## 2017-09-24 ENCOUNTER — Ambulatory Visit (INDEPENDENT_AMBULATORY_CARE_PROVIDER_SITE_OTHER): Payer: BLUE CROSS/BLUE SHIELD | Admitting: Internal Medicine

## 2017-09-24 ENCOUNTER — Encounter: Payer: Self-pay | Admitting: Internal Medicine

## 2017-09-24 VITALS — BP 142/78 | Temp 98.5°F | Wt 277.0 lb

## 2017-09-24 DIAGNOSIS — I10 Essential (primary) hypertension: Secondary | ICD-10-CM | POA: Diagnosis not present

## 2017-09-24 MED ORDER — ROPINIROLE HCL 4 MG PO TABS: 4.0000 mg | ORAL_TABLET | Freq: Every day | ORAL | 2 refills | Status: DC

## 2017-09-24 MED ORDER — CARBIDOPA-LEVODOPA ER 50-200 MG PO TBCR: 1.0000 | EXTENDED_RELEASE_TABLET | Freq: Two times a day (BID) | ORAL | 3 refills | Status: DC

## 2017-09-24 NOTE — Progress Notes (Signed)
Subjective:    Patient ID: Patrick [name], male    DOB: 02-Sep-1965, 52 y.o.   MRN: [id number]  HPI  52 year old patient who has a long history of RL and has been followed by neurology in the past.  He has not been seen in this office since December 2017.  In the interval he has had successful left hip replacement surgery and has done quite well and has been able to return to work. He is seen here today for medication refills. At the present time he has been on Requip 3 mg at bedtime as well as carbidopa levodopa as needed for daytime symptoms He was also requesting a refill of Tono-Pen for anxiety which was discouraged.   Past Medical History:  Diagnosis Date  . Arthritis   . Blood transfusion without reported diagnosis 1984   20+ units  . RLS (restless legs syndrome)   . Tobacco use      Social History   Socioeconomic History  . Marital status: Single    Spouse name: Not on file  . Number of children: Not on file  . Years of education: Not on file  . Highest education level: Not on file  Occupational History  . Not on file  Social Needs  . Financial resource strain: Not on file  . Food insecurity:    Worry: Not on file    Inability: Not on file  . Transportation needs:    Medical: Not on file    Non-medical: Not on file  Tobacco Use  . Smoking status: Current Every Day Smoker    Packs/day: 0.75    Years: 35.00    Pack years: 26.25    Types: Cigarettes  . Smokeless tobacco: Never Used  . Tobacco comment: cut back from 1 pack to 1/2 pack day - 35x years  Substance and Sexual Activity  . Alcohol use: Yes    Alcohol/week: 0.0 oz    Comment: twice a month  . Drug use: No  . Sexual activity: Not on file  Lifestyle  . Physical activity:    Days per week: Not on file    Minutes per session: Not on file  . Stress: Not on file  Relationships  . Social connections:    Talks on phone: Not on file    Gets together: Not on file    Attends religious service: Not on  file    Active member of club or organization: Not on file    Attends meetings of clubs or organizations: Not on file    Relationship status: Not on file  . Intimate partner violence:    Fear of current or ex partner: Not on file    Emotionally abused: Not on file    Physically abused: Not on file    Forced sexual activity: Not on file  Other Topics Concern  . 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     Highest level of education:  High school   He lives alone.  He has one son.    Currently not working.    Past Surgical History:  Procedure Laterality Date  . arm surgery  1984   MVI  . CARDIAC SURGERY  1984   MVI  . CHOLECYSTECTOMY    . KIDNEY SURGERY  1984   MVI  . MANDIBLE FRACTURE SURGERY  1984   MVI  . TOTAL HIP ARTHROPLASTY Left 09/07/2016   Procedure:  TOTAL HIP ARTHROPLASTY;  Surgeon: Earlie Server, MD;  Location: [facility];  Service: Orthopedics;  Laterality: Left;    Family History  Problem Relation Age of Onset  . Leukemia Father 61       Due to agent orange, deceased  . Hearing loss Mother        Living  . Diabetes Brother   . Obesity Brother   . Healthy Brother   . Alcohol abuse Sister   . Colon cancer Neg Hx     Allergies  Allergen Reactions  . Banana Nausea And Vomiting    Current Outpatient Medications on File Prior to Visit  Medication Sig Dispense Refill  . aspirin 325 MG tablet Take 1 tablet (325 mg total) by mouth 2 (two) times daily. 30 tablet 0   No current facility-administered medications on file prior to visit.     BP (!) 142/78 (BP Location: Right Arm, Patient Position: Sitting, Cuff Size: Large)   Temp 98.5 F (36.9 C) (Oral)   Wt 277 lb (125.6 kg)   BMI 41.51 kg/m     Review of Systems  Constitutional: Negative for appetite change, chills, fatigue and fever.  HENT: Negative for congestion, dental problem, ear pain, hearing loss, sore throat, tinnitus, trouble swallowing and voice  change.   Eyes: Negative for pain, discharge and visual disturbance.  Respiratory: Negative for cough, chest tightness, wheezing and stridor.   Cardiovascular: Negative for chest pain, palpitations and leg swelling.  Gastrointestinal: Negative for abdominal distention, abdominal pain, blood in stool, constipation, diarrhea, nausea and vomiting.  Genitourinary: Negative for difficulty urinating, discharge, flank pain, genital sores, hematuria and urgency.  Musculoskeletal: Negative for arthralgias, back pain, gait problem, joint swelling, myalgias and neck stiffness.  Skin: Negative for rash.  Neurological: Negative for dizziness, syncope, speech difficulty, weakness, numbness and headaches.  Hematological: Negative for adenopathy. Does not bruise/bleed easily.  Psychiatric/Behavioral: Positive for sleep disturbance. Negative for behavioral problems and dysphoric mood. The patient is nervous/anxious.        Objective:   Physical Exam  Constitutional: He is oriented to person, place, and time. He appears well-developed.  Blood pressure 140/82 Weight 277  HENT:  Head: Normocephalic.  Right Ear: External ear normal.  Left Ear: External ear normal.  Eyes: Conjunctivae and EOM are normal.  Neck: Normal range of motion.  Cardiovascular: Normal rate and normal heart sounds.  Pulmonary/Chest: Breath sounds normal.  Abdominal: Bowel sounds are normal.  Musculoskeletal: Normal range of motion. He exhibits no edema or tenderness.  Neurological: He is alert and oriented to person, place, and time.  Psychiatric: He has a normal mood and affect. His behavior is normal.          Assessment & Plan:  Restless leg syndrome.  Medications refilled.  Follow-up neurology Status post left total hip replacement surgery Anxiety disorder  Suggested patient return in 3 months for an annual exam.  Marletta Lor

## 2017-09-24 NOTE — Patient Instructions (Signed)
Return for an  annual examination in 3 months

## 2017-11-19 ENCOUNTER — Encounter: Payer: Self-pay | Admitting: Internal Medicine

## 2017-11-19 ENCOUNTER — Ambulatory Visit (INDEPENDENT_AMBULATORY_CARE_PROVIDER_SITE_OTHER): Payer: BLUE CROSS/BLUE SHIELD | Admitting: Internal Medicine

## 2017-11-19 VITALS — BP 120/90 | HR 88 | Temp 98.3°F | Ht 68.0 in | Wt 272.6 lb

## 2017-11-19 DIAGNOSIS — Z0000 Encounter for general adult medical examination without abnormal findings: Secondary | ICD-10-CM | POA: Diagnosis not present

## 2017-11-19 DIAGNOSIS — Z125 Encounter for screening for malignant neoplasm of prostate: Secondary | ICD-10-CM

## 2017-11-19 LAB — CBC WITH DIFFERENTIAL/PLATELET
BASOS ABS: 0 10*3/uL (ref 0.0–0.1)
Basophils Relative: 0.3 % (ref 0.0–3.0)
EOS ABS: 0.1 10*3/uL (ref 0.0–0.7)
Eosinophils Relative: 1.2 % (ref 0.0–5.0)
HEMATOCRIT: 45 % (ref 39.0–52.0)
HEMOGLOBIN: 15.5 g/dL (ref 13.0–17.0)
LYMPHS PCT: 28.8 % (ref 12.0–46.0)
Lymphs Abs: 2.7 10*3/uL (ref 0.7–4.0)
MCHC: 34.5 g/dL (ref 30.0–36.0)
MCV: 91.8 fl (ref 78.0–100.0)
MONOS PCT: 8.8 % (ref 3.0–12.0)
Monocytes Absolute: 0.8 10*3/uL (ref 0.1–1.0)
NEUTROS ABS: 5.6 10*3/uL (ref 1.4–7.7)
Neutrophils Relative %: 60.9 % (ref 43.0–77.0)
PLATELETS: 205 10*3/uL (ref 150.0–400.0)
RBC: 4.9 Mil/uL (ref 4.22–5.81)
RDW: 14.4 % (ref 11.5–15.5)
WBC: 9.2 10*3/uL (ref 4.0–10.5)

## 2017-11-19 LAB — COMPREHENSIVE METABOLIC PANEL
ALBUMIN: 4.4 g/dL (ref 3.5–5.2)
ALT: 17 U/L (ref 0–53)
AST: 18 U/L (ref 0–37)
Alkaline Phosphatase: 62 U/L (ref 39–117)
BUN: 20 mg/dL (ref 6–23)
CHLORIDE: 102 meq/L (ref 96–112)
CO2: 33 meq/L — AB (ref 19–32)
CREATININE: 0.97 mg/dL (ref 0.40–1.50)
Calcium: 9.9 mg/dL (ref 8.4–10.5)
GFR: 85.63 mL/min (ref >60.00)
Glucose, Bld: 95 mg/dL (ref 70–99)
Potassium: 4.8 mEq/L (ref 3.5–5.1)
SODIUM: 140 meq/L (ref 135–145)
Total Bilirubin: 0.9 mg/dL (ref 0.2–1.2)
Total Protein: 7.5 g/dL (ref 6.0–8.3)

## 2017-11-19 LAB — LIPID PANEL
CHOLESTEROL: 158 mg/dL (ref 0–200)
HDL: 65.2 mg/dL (ref >39.00)
LDL CALC: 80 mg/dL (ref 0–99)
NonHDL: 93.16
Total CHOL/HDL Ratio: 2
Triglycerides: 66 mg/dL (ref 0.0–149.0)
VLDL: 13.2 mg/dL (ref 0.0–40.0)

## 2017-11-19 LAB — PSA: PSA: 1.91 ng/mL (ref 0.10–4.00)

## 2017-11-19 LAB — TSH: TSH: 2.91 u[IU]/mL (ref 0.35–4.50)

## 2017-11-19 MED ORDER — CARBIDOPA-LEVODOPA ER 50-200 MG PO TBCR: 1.0000 | EXTENDED_RELEASE_TABLET | Freq: Two times a day (BID) | ORAL | 3 refills | Status: DC

## 2017-11-19 MED ORDER — ROPINIROLE HCL 4 MG PO TABS: 4.0000 mg | ORAL_TABLET | Freq: Every day | ORAL | 2 refills | Status: DC

## 2017-11-19 NOTE — Patient Instructions (Addendum)
Schedule your colonoscopy to help detect colon cancer.   Health Maintenance, Male A healthy lifestyle and preventive care is important for your health and wellness. Ask your health care provider about what schedule of regular examinations is right for you. What should I know about weight and diet? Eat a Healthy Diet  Eat plenty of vegetables, fruits, whole grains, low-fat dairy products, and lean protein.  Do not eat a lot of foods high in solid fats, added sugars, or salt.  Maintain a Healthy Weight Regular exercise can help you achieve or maintain a healthy weight. You should:  Do at least 150 minutes of exercise each week. The exercise should increase your heart rate and make you sweat (moderate-intensity exercise).  Do strength-training exercises at least twice a week.  Watch Your Levels of Cholesterol and Blood Lipids  Have your blood tested for lipids and cholesterol every 5 years starting at 54 years of age. If you are at high risk for heart disease, you should start having your blood tested when you are 54 years old. You may need to have your cholesterol levels checked more often if: ? Your lipid or cholesterol levels are high. ? You are older than 54 years of age. ? You are at high risk for heart disease.  What should I know about cancer screening? Many types of cancers can be detected early and may often be prevented. Lung Cancer  You should be screened every year for lung cancer if: ? You are a current smoker who has smoked for at least 30 years. ? You are a former smoker who has quit within the past 15 years.  Talk to your health care provider about your screening options, when you should start screening, and how often you should be screened.  Colorectal Cancer  Routine colorectal cancer screening usually begins at 54 years of age and should be repeated every 5-10 years until you are 54 years old. You may need to be screened more often if early forms of precancerous  polyps or small growths are found. Your health care provider may recommend screening at an earlier age if you have risk factors for colon cancer.  Your health care provider may recommend using home test kits to check for hidden blood in the stool.  A small camera at the end of a tube can be used to examine your colon (sigmoidoscopy or colonoscopy). This checks for the earliest forms of colorectal cancer.  Prostate and Testicular Cancer  Depending on your age and overall health, your health care provider may do certain tests to screen for prostate and testicular cancer.  Talk to your health care provider about any symptoms or concerns you have about testicular or prostate cancer.  Skin Cancer  Check your skin from head to toe regularly.  Tell your health care provider about any new moles or changes in moles, especially if: ? There is a change in a mole's size, shape, or color. ? You have a mole that is larger than a pencil eraser.  Always use sunscreen. Apply sunscreen liberally and repeat throughout the day.  Protect yourself by wearing long sleeves, pants, a wide-brimmed hat, and sunglasses when outside.  What should I know about heart disease, diabetes, and high blood pressure?  If you are 85-86 years of age, have your blood pressure checked every 3-5 years. If you are 8 years of age or older, have your blood pressure checked every year. You should have your blood pressure measured twice-once when  you are at a hospital or clinic, and once when you are not at a hospital or clinic. Record the average of the two measurements. To check your blood pressure when you are not at a hospital or clinic, you can use: ? An automated blood pressure machine at a pharmacy. ? A home blood pressure monitor.  Talk to your health care provider about your target blood pressure.  If you are between 64-34 years old, ask your health care provider if you should take aspirin to prevent heart  disease.  Have regular diabetes screenings by checking your fasting blood sugar level. ? If you are at a normal weight and have a low risk for diabetes, have this test once every three years after the age of 37. ? If you are overweight and have a high risk for diabetes, consider being tested at a younger age or more often.  A one-time screening for abdominal aortic aneurysm (AAA) by ultrasound is recommended for men aged 3-75 years who are current or former smokers. What should I know about preventing infection? Hepatitis B If you have a higher risk for hepatitis B, you should be screened for this virus. Talk with your health care provider to find out if you are at risk for hepatitis B infection. Hepatitis C Blood testing is recommended for:  Everyone born from [birth year] through 1967.  Anyone with known risk factors for hepatitis C.  Sexually Transmitted Diseases (STDs)  You should be screened each year for STDs including gonorrhea and chlamydia if: ? You are sexually active and are younger than 54 years of age. ? You are older than 54 years of age and your health care provider tells you that you are at risk for this type of infection. ? Your sexual activity has changed since you were last screened and you are at an increased risk for chlamydia or gonorrhea. Ask your health care provider if you are at risk.  Talk with your health care provider about whether you are at high risk of being infected with HIV. Your health care provider may recommend a prescription medicine to help prevent HIV infection.  What else can I do?  Schedule regular health, dental, and eye exams.  Stay current with your vaccines (immunizations).  Do not use any tobacco products, such as cigarettes, chewing tobacco, and e-cigarettes. If you need help quitting, ask your health care provider.  Limit alcohol intake to no more than 2 drinks per day. One drink equals 12 ounces of beer, 5 ounces of wine, or 1 ounces of  hard liquor.  Do not use street drugs.  Do not share needles.  Ask your health care provider for help if you need support or information about quitting drugs.  Tell your health care provider if you often feel depressed.  Tell your health care provider if you have ever been abused or do not feel safe at home. This information is not intended to replace advice given to you by your health care provider. Make sure you discuss any questions you have with your health care provider. Document Released: 09/22/2007 Document Revised: 11/23/2015 Document Reviewed: 12/28/2014 Elsevier Interactive Patient Education  [employer].

## 2017-11-19 NOTE — Progress Notes (Signed)
Subjective:    Patient ID: Patrick [name], male    DOB: [birth year], 52 y.o.   MRN: [id number]  HPI 52 year old patient who is seen today for a preventive health examination. He has done quite well since a left total hip replacement in June of this year. He has a history of impaired glucose tolerance as well as ongoing tobacco use. He is followed for restless leg syndrome which is controlled nicely on present regimen.  Family history.  Father died at 21 complications of unclear cancer type; history of agent orange exposure mother age 37 in excellent health 2 brothers positive for diabetes and hypertension one sister with alcoholism  Past Medical History:  Diagnosis Date  . Arthritis   . Blood transfusion without reported diagnosis 1984   20+ units  . RLS (restless legs syndrome)   . Tobacco use      Social History   Socioeconomic History  . Marital status: Single    Spouse name: Not on file  . Number of children: Not on file  . Years of education: Not on file  . Highest education level: Not on file  Occupational History  . Not on file  Social Needs  . Financial resource strain: Not on file  . Food insecurity:    Worry: Not on file    Inability: Not on file  . Transportation needs:    Medical: Not on file    Non-medical: Not on file  Tobacco Use  . Smoking status: Current Every Day Smoker    Packs/day: 0.75    Years: 35.00    Pack years: 26.25    Types: Cigarettes  . Smokeless tobacco: Never Used  . Tobacco comment: cut back from 1 pack to 1/2 pack day - 35x years  Substance and Sexual Activity  . Alcohol use: Yes    Alcohol/week: 0.0 standard drinks    Comment: twice a month  . Drug use: No  . Sexual activity: Not on file  Lifestyle  . Physical activity:    Days per week: Not on file    Minutes per session: Not on file  . Stress: Not on file  Relationships  . Social connections:    Talks on phone: Not on file    Gets together: Not on file    Attends  religious service: Not on file    Active member of club or organization: Not on file    Attends meetings of clubs or organizations: Not on file    Relationship status: Not on file  . Intimate partner violence:    Fear of current or ex partner: Not on file    Emotionally abused: Not on file    Physically abused: Not on file    Forced sexual activity: Not on file  Other Topics Concern  . 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     Highest level of education:  High school   He lives alone.  He has one son.    Currently not working.    Past Surgical History:  Procedure Laterality Date  . arm surgery  1984   MVI  . CARDIAC SURGERY  1984   MVI  . CHOLECYSTECTOMY    . KIDNEY SURGERY  1984   MVI  . MANDIBLE FRACTURE SURGERY  1984   MVI  . TOTAL HIP ARTHROPLASTY Left 09/07/2016   Procedure: TOTAL HIP ARTHROPLASTY;  Surgeon: Earlie Server, MD;  Location:  [facility];  Service: Orthopedics;  Laterality: Left;    Family History  Problem Relation Age of Onset  . Leukemia Father 61       Due to agent orange, deceased  . Hearing loss Mother        Living  . Diabetes Brother   . Obesity Brother   . Healthy Brother   . Alcohol abuse Sister   . Colon cancer Neg Hx     Allergies  Allergen Reactions  . Banana Nausea And Vomiting    Current Outpatient Medications on File Prior to Visit  Medication Sig Dispense Refill  . aspirin 325 MG tablet Take 1 tablet (325 mg total) by mouth 2 (two) times daily. 30 tablet 0   No current facility-administered medications on file prior to visit.     BP 120/90 (BP Location: Right Arm, Patient Position: Sitting, Cuff Size: Large)   Pulse 88   Temp 98.3 F (36.8 C) (Oral)   Ht 5\' 8"  (1.727 m)   Wt 272 lb 9.6 oz (123.7 kg)   SpO2 95%   BMI 41.45 kg/m      Review of Systems  Constitutional: Negative for appetite change, chills, fatigue and fever.  HENT: Negative for congestion, dental problem,  ear pain, hearing loss, sore throat, tinnitus, trouble swallowing and voice change.   Eyes: Negative for pain, discharge and visual disturbance.  Respiratory: Negative for cough, chest tightness, wheezing and stridor.   Cardiovascular: Negative for chest pain, palpitations and leg swelling.  Gastrointestinal: Negative for abdominal distention, abdominal pain, blood in stool, constipation, diarrhea, nausea and vomiting.  Genitourinary: Negative for difficulty urinating, discharge, flank pain, genital sores, hematuria and urgency.  Musculoskeletal: Negative for arthralgias, back pain, gait problem, joint swelling, myalgias and neck stiffness.  Skin: Negative for rash.  Neurological: Negative for dizziness, syncope, speech difficulty, weakness, numbness and headaches.  Hematological: Negative for adenopathy. Does not bruise/bleed easily.  Psychiatric/Behavioral: Negative for behavioral problems and dysphoric mood. The patient is not nervous/anxious.        Objective:   Physical Exam  Constitutional: He appears well-developed and well-nourished.  Weight 272  HENT:  Head: Normocephalic and atraumatic.  Right Ear: External ear normal.  Left Ear: External ear normal.  Nose: Nose normal.  Mouth/Throat: Oropharynx is clear and moist.  Eyes: Pupils are equal, round, and reactive to light. Conjunctivae and EOM are normal. No scleral icterus.  Neck: Normal range of motion. Neck supple. No JVD present. No thyromegaly present.  Cardiovascular: Regular rhythm, normal heart sounds and intact distal pulses. Exam reveals no gallop and no friction rub.  No murmur heard. Pulmonary/Chest: Effort normal and breath sounds normal. He exhibits no tenderness.  Abdominal: Soft. Bowel sounds are normal. He exhibits no distension and no mass. There is no tenderness.  Genitourinary: Prostate normal and penis normal.  Musculoskeletal: Normal range of motion. He exhibits no edema or tenderness.  Lymphadenopathy:     He has no cervical adenopathy.  Neurological: He is alert. He has normal reflexes. No cranial nerve deficit. Coordination normal.  Skin: Skin is warm and dry. No rash noted.  Surgical scar left posterior lateral back Right lower quadrant abdominal and midline scar Stasis changes left lower extremity  Psychiatric: He has a normal mood and affect. His behavior is normal.          Assessment & Plan:   Preventive health examination Restless leg syndrome. Exogenous obesity Ongoing tobacco use  Will check updated lab Colonoscopy encouraged  Total smoking cessation encouraged Weight loss and regular exercise recommended  Follow-up in 6 to 12 months  Marletta Lor

## 2017-11-20 LAB — HEPATITIS C ANTIBODY
Hepatitis C Ab: NONREACTIVE
SIGNAL TO CUT-OFF: 0.04 (ref <1.00)

## 2018-10-05 ENCOUNTER — Encounter (HOSPITAL_COMMUNITY): Payer: Self-pay | Admitting: *Deleted

## 2018-10-05 ENCOUNTER — Emergency Department (HOSPITAL_COMMUNITY)
Admission: EM | Admit: 2018-10-05 | Discharge: 2018-10-05 | Disposition: A | Discharge status: Home / Self Care | Payer: BLUE CROSS/BLUE SHIELD | Attending: Emergency Medicine | Admitting: Emergency Medicine

## 2018-10-05 ENCOUNTER — Other Ambulatory Visit: Payer: Self-pay

## 2018-10-05 ENCOUNTER — Emergency Department (HOSPITAL_COMMUNITY): Payer: BLUE CROSS/BLUE SHIELD

## 2018-10-05 DIAGNOSIS — Y939 Activity, unspecified: Secondary | ICD-10-CM | POA: Insufficient documentation

## 2018-10-05 DIAGNOSIS — Z79899 Other long term (current) drug therapy: Secondary | ICD-10-CM | POA: Insufficient documentation

## 2018-10-05 DIAGNOSIS — Y929 Unspecified place or not applicable: Secondary | ICD-10-CM | POA: Insufficient documentation

## 2018-10-05 DIAGNOSIS — W2209XA Striking against other stationary object, initial encounter: Secondary | ICD-10-CM | POA: Diagnosis not present

## 2018-10-05 DIAGNOSIS — Y999 Unspecified external cause status: Secondary | ICD-10-CM | POA: Insufficient documentation

## 2018-10-05 DIAGNOSIS — S81802A Unspecified open wound, left lower leg, initial encounter: Secondary | ICD-10-CM | POA: Insufficient documentation

## 2018-10-05 DIAGNOSIS — L089 Local infection of the skin and subcutaneous tissue, unspecified: Secondary | ICD-10-CM | POA: Diagnosis present

## 2018-10-05 DIAGNOSIS — F17210 Nicotine dependence, cigarettes, uncomplicated: Secondary | ICD-10-CM | POA: Insufficient documentation

## 2018-10-05 DIAGNOSIS — Z23 Encounter for immunization: Secondary | ICD-10-CM | POA: Diagnosis not present

## 2018-10-05 DIAGNOSIS — Z96642 Presence of left artificial hip joint: Secondary | ICD-10-CM | POA: Insufficient documentation

## 2018-10-05 MED ORDER — CEPHALEXIN 500 MG PO CAPS: 500.0000 mg | ORAL_CAPSULE | Freq: Four times a day (QID) | ORAL | 0 refills | Status: AC

## 2018-10-05 MED ORDER — GELATIN ABSORBABLE 12-7 MM EX MISC
1.0000 | Freq: Once | CUTANEOUS | Status: DC
  Filled 2018-10-05: qty 1

## 2018-10-05 MED ORDER — TETANUS-DIPHTH-ACELL PERTUSSIS 5-2.5-18.5 LF-MCG/0.5 IM SUSP
0.5000 mL | Freq: Once | INTRAMUSCULAR | Status: AC
  Administered 2018-10-05: 0.5 mL via INTRAMUSCULAR
  Filled 2018-10-05: qty 0.5

## 2018-10-05 NOTE — ED Triage Notes (Signed)
Pt hit his left ankle on a wooden pallet 2 months ago. Pt has been bandaging his ankle since, but reports bleeding started started again this morning.

## 2018-10-05 NOTE — ED Provider Notes (Signed)
[facility] DEPT Provider Note   CSN: [id number] Arrival date & time: 10/05/18  3382    History   Chief Complaint Chief Complaint  Patient presents with  . Wound Check    HPI Patrick [name] is a 53 y.o. male.     53 year old male presents with complaint of nonhealing wound to his left lower leg.  Patient states that he hit his leg on a wood pallet about 2 months ago, wound has been slowly healing since that time however in bleeding area since this morning and has had difficulty controlling the bleeding, bleeding started upon standing this morning.  Patient is not a diabetic however states at his last physical 2 years ago he was told he was borderline, has not been to a doctor in the past 2 years due to lack of insurance, now with coverage and ready to get his leg taken care of.     Past Medical History:  Diagnosis Date  . Arthritis   . Blood transfusion without reported diagnosis 1984   20+ units  . RLS (restless legs syndrome)   . Tobacco use     Patient Active Problem List   Diagnosis Date Noted  . 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 Obesity 02/23/2014  . Impaired glucose tolerance 02/23/2014  . Chest pain 08/29/2010  . DEHYDRATION 11/24/2009  . TOBACCO USER 11/24/2009  . GASTROENTERITIS, ACUTE 11/24/2009  . ABDOMINAL PAIN, EPIGASTRIC 11/24/2009  . BENIGN PROSTATIC HYPERTROPHY, MILD, HX OF 10/28/2009  . Essential hypertension 05/25/2008  . BIPOLAR DISORDER UNSPECIFIED 05/02/2007  . RESTLESS LEG SYNDROME 10/21/2006  . GERD 10/21/2006    Past Surgical History:  Procedure Laterality Date  . arm surgery  1984   MVI  . CARDIAC SURGERY  1984   MVI  . CHOLECYSTECTOMY    . KIDNEY SURGERY  1984   MVI  . MANDIBLE FRACTURE SURGERY  1984   MVI  . TOTAL HIP ARTHROPLASTY Left 09/07/2016   Procedure: TOTAL HIP ARTHROPLASTY;  Surgeon: Earlie Server, MD;  Location: [facility];  Service: Orthopedics;  Laterality: Left;         Home Medications    Prior to Admission medications   Medication Sig Start Date End Date Taking? Authorizing Provider  acetaminophen (TYLENOL) 500 MG tablet Take 1,500 mg by mouth every 6 (six) hours as needed for mild pain.   Yes [provider]  carbidopa-levodopa (SINEMET CR) 50-200 MG tablet Take 1 tablet by mouth 2 (two) times daily. 11/19/17  Yes Marletta Lor, MD  ibuprofen (ADVIL) 200 MG tablet Take 400 mg by mouth every 6 (six) hours as needed for mild pain.   Yes [provider]  rOPINIRole (REQUIP) 4 MG tablet Take 1 tablet (4 mg total) by mouth at bedtime. 11/19/17  Yes Marletta Lor, MD  aspirin 325 MG tablet Take 1 tablet (325 mg total) by mouth 2 (two) times daily. Patient not taking: Reported on 10/05/2018 09/08/16   Donia Ast, PA  cephALEXin (KEFLEX) 500 MG capsule Take 1 capsule (500 mg total) by mouth 4 (four) times daily for 7 days. 10/05/18 10/12/18  Tacy Learn, PA-C    Family History Family History  Problem Relation Age of Onset  . Leukemia Father 49       Due to agent orange, deceased  . Hearing loss Mother        Living  . Diabetes Brother   . Obesity Brother   .  Healthy Brother   . Alcohol abuse Sister   . Colon cancer Neg Hx     Social History Social History   Tobacco Use  . Smoking status: Current Every Day Smoker    Packs/day: 0.75    Years: 35.00    Pack years: 26.25    Types: Cigarettes  . Smokeless tobacco: Never Used  . Tobacco comment: cut back from 1 pack to 1/2 pack day - 35x years  Substance Use Topics  . Alcohol use: Yes    Alcohol/week: 0.0 standard drinks    Comment: twice a month  . Drug use: No     Allergies   Banana   Review of Systems Review of Systems  Constitutional: Negative for fever.  Musculoskeletal: Negative for arthralgias and myalgias.  Skin: Positive for wound.  Allergic/Immunologic: Negative for immunocompromised state.  Neurological: Negative for weakness and  numbness.  Hematological: Does not bruise/bleed easily.  Psychiatric/Behavioral: Negative for confusion.  All other systems reviewed and are negative.    Physical Exam Updated Vital Signs BP (!) 157/102   Pulse 86   Temp 98.7 F (37.1 C) (Oral)   Resp 18   SpO2 96%   Physical Exam Vitals signs and nursing note reviewed.  Constitutional:      General: He is not in acute distress.    Appearance: He is well-developed. He is not diaphoretic.  HENT:     Head: Normocephalic and atraumatic.  Cardiovascular:     Pulses: Normal pulses.  Pulmonary:     Effort: Pulmonary effort is normal.  Musculoskeletal:        General: Swelling, tenderness and signs of injury present. No deformity.       Legs:  Skin:    General: Skin is warm and dry.     Findings: No erythema.  Neurological:     Mental Status: He is alert and oriented to person, place, and time.     Sensory: No sensory deficit.  Psychiatric:        Behavior: Behavior normal.   Media Information    Media Information    Document Information  Photos    10/05/2018 10:35  Attached To:  Hospital Encounter on 10/05/18  Source Information  Roque Lias  [facility]     Document Information  Photos    10/05/2018 10:35  Attached To:  Hospital Encounter on 10/05/18  Source Information  Roque Lias  [facility]      ED Treatments / Results  Labs (all labs ordered are listed, but only abnormal results are displayed) Labs Reviewed - No data to display  EKG None  Radiology Dg Tibia/fibula Left  Result Date: 10/05/2018 CLINICAL DATA:  Injury to LEFT ankle/LEFT lower leg 2 months ago, bandaged since but bleeding starting again this morning. EXAM: LEFT TIBIA AND FIBULA - 2 VIEW COMPARISON:  None. FINDINGS: Osseous alignment is normal. Old avulsion fracture fragment underlying the medial malleolus. No acute appearing fracture line or displaced fracture fragment seen.  Heterogeneity at the lateral aspects of the talar dome, suspicious for old injury and/or osteochondral defect. No acute or suspicious finding within the tibia or fibula. Soft tissues about the tibia and fibula are unremarkable. No soft tissue gas or foreign body appreciated. IMPRESSION: 1. No acute appearing osseous or soft tissue findings. 2. Heterogeneity at the lateral aspects of the talar dome, suspicious for old injury and/or osteochondral defect. Consider nonemergent ankle MRI for further characterization. Electronically Signed   By:  Franki Cabot M.D.   On: 10/05/2018 10:17    Procedures Procedures (including critical care time)  Medications Ordered in ED Medications  gelatin adsorbable (GELFOAM/SURGIFOAM) sponge 12-7 mm 1 each (has no administration in time range)  Tdap (BOOSTRIX) injection 0.5 mL (0.5 mLs Intramuscular Given 10/05/18 1027)     Initial Impression / Assessment and Plan / ED Course  I have reviewed the triage vital signs and the nursing notes.  Pertinent labs & imaging results that were available during my care of the patient were reviewed by me and considered in my medical decision making (see chart for details).  Clinical Course as of Oct 05 1226  Sun Jun 28, 286  4452 53 year old male with left lower leg wound x2 months.  Patient states he got up and then today and applied pressure to his leg he had bleeding from 1 of the wounds described as a constant steady stream of blood.  Patient applied a dressing to this area and came to the ER.  Upon arrival patient's dressing was saturated with ongoing bleeding appears to be from varicose vein.  Pressure dressing applied and limb elevated.  X-ray of the lower legs does not show osteomyelitis or gas.  Bleeding is controlled.  Wounds documented and dressed with Gelfoam and Vaseline gauze with a nonstick dressing.  Patient be placed on Keflex for mild erythema around the wounds, this is noncircumferential, not streaking.  Tetanus  was updated.  Patient plans to follow-up with his l [place]'s primary care provider this week, need referral to wound care specialist.  Also recommend patient elevate his leg to help decrease swelling in the extremity.   [LM]    Clinical Course User Index [LM] Tacy Learn, PA-C      Final Clinical Impressions(s) / ED Diagnoses   Final diagnoses:  Wound infection    ED Discharge Orders         Ordered    cephALEXin (KEFLEX) 500 MG capsule  4 times daily     10/05/18 1047           Roque Lias 10/05/18 1228    Lajean Saver, MD 10/05/18 1404

## 2018-10-05 NOTE — Discharge Instructions (Signed)
Take Keflex as prescribed and complete the full course. Elevate leg above the level of your heart as much as possible to help reduce leg swelling for better healing.  Clean area with Dove (mild) soap. Follow up with your doctor this week for recheck, may need a referral to wound care.

## 2018-10-07 ENCOUNTER — Other Ambulatory Visit: Payer: Self-pay

## 2018-10-07 ENCOUNTER — Ambulatory Visit: Payer: BLUE CROSS/BLUE SHIELD | Admitting: Internal Medicine

## 2018-10-07 ENCOUNTER — Telehealth: Payer: Self-pay | Admitting: *Deleted

## 2018-10-07 ENCOUNTER — Encounter: Payer: Self-pay | Admitting: Internal Medicine

## 2018-10-07 VITALS — BP 140/90 | HR 84 | Temp 99.4°F | Wt 272.3 lb

## 2018-10-07 DIAGNOSIS — F17200 Nicotine dependence, unspecified, uncomplicated: Secondary | ICD-10-CM | POA: Diagnosis not present

## 2018-10-07 DIAGNOSIS — I83023 Varicose veins of left lower extremity with ulcer of ankle: Secondary | ICD-10-CM | POA: Diagnosis not present

## 2018-10-07 DIAGNOSIS — R7302 Impaired glucose tolerance (oral): Secondary | ICD-10-CM

## 2018-10-07 DIAGNOSIS — L97329 Non-pressure chronic ulcer of left ankle with unspecified severity: Secondary | ICD-10-CM

## 2018-10-07 DIAGNOSIS — G2581 Restless legs syndrome: Secondary | ICD-10-CM

## 2018-10-07 DIAGNOSIS — R7309 Other abnormal glucose: Secondary | ICD-10-CM

## 2018-10-07 DIAGNOSIS — F419 Anxiety disorder, unspecified: Secondary | ICD-10-CM

## 2018-10-07 DIAGNOSIS — S81802A Unspecified open wound, left lower leg, initial encounter: Secondary | ICD-10-CM

## 2018-10-07 DIAGNOSIS — I10 Essential (primary) hypertension: Secondary | ICD-10-CM

## 2018-10-07 DIAGNOSIS — K219 Gastro-esophageal reflux disease without esophagitis: Secondary | ICD-10-CM

## 2018-10-07 LAB — POCT GLYCOSYLATED HEMOGLOBIN (HGB A1C): Hemoglobin A1C: 6.1 % — AB (ref 4.0–5.6)

## 2018-10-07 MED ORDER — ALPRAZOLAM 0.25 MG PO TABS: 0.2500 mg | ORAL_TABLET | Freq: Every day | ORAL | 0 refills | Status: DC | PRN

## 2018-10-07 MED ORDER — ROPINIROLE HCL 1 MG PO TABS: ORAL_TABLET | ORAL | 0 refills | Status: DC

## 2018-10-07 MED ORDER — ROPINIROLE HCL 1 MG PO TABS: 1.0000 mg | ORAL_TABLET | Freq: Three times a day (TID) | ORAL | 0 refills | Status: DC

## 2018-10-07 MED ORDER — ROPINIROLE HCL 4 MG PO TABS: 4.0000 mg | ORAL_TABLET | Freq: Every day | ORAL | 1 refills | Status: DC

## 2018-10-07 NOTE — Telephone Encounter (Signed)
Copied from [place] [id number]. Topic: General - Other >> Oct 07, 2018  9:23 AM Jodie Echevaria wrote: Reason for CRM: [place], [place]. [phone] (Phone) [phone] (Fax) called to clarify if both of these Rx are to be filled rOPINIRole (REQUIP) 1 MG tablet and rOPINIRole (REQUIP) 4 MG tablet Please call pharmacy and advise

## 2018-10-07 NOTE — Telephone Encounter (Signed)
Yes: 4 mg at bedtime, 1 mg is for half tablet in the am as needed

## 2018-10-07 NOTE — Progress Notes (Signed)
Established Patient Office Visit     CC/Reason for Visit: Establish care, left leg wound non healing  HPI: Patrick [name] is a 53 y.o. male who is coming in today for the above mentioned reasons. Past sue for annual physical, lost to medical care for 2 years as he lost his insurance. Past Medical History is significant for: morbid obesity, HTN (not on meds), IGT, GERD (not on meds), RLS on BID requip dosing. He also has a h/o tobacco abuse of about 1/2 PPD for over 30 years. He states 2 months ago hit his left leg on a wooden pellet and developed a wound that has been slow to heal; was seen in th ED on 6/28 as the wound started bleeding. Pictures in chart from that day.   Past Medical/Surgical History: Past Medical History:  Diagnosis Date  . Arthritis   . Blood transfusion without reported diagnosis 1984   20+ units  . RLS (restless legs syndrome)   . Tobacco use     Past Surgical History:  Procedure Laterality Date  . arm surgery  1984   MVI  . CARDIAC SURGERY  1984   MVI  . CHOLECYSTECTOMY    . KIDNEY SURGERY  1984   MVI  . MANDIBLE FRACTURE SURGERY  1984   MVI  . TOTAL HIP ARTHROPLASTY Left 09/07/2016   Procedure: TOTAL HIP ARTHROPLASTY;  Surgeon: Earlie Server, MD;  Location: [facility];  Service: Orthopedics;  Laterality: Left;    Social History:  reports that he has been smoking cigarettes. He has a 26.25 pack-year smoking history. He has never used smokeless tobacco. He reports current alcohol use. He reports that he does not use drugs.  Allergies: Allergies  Allergen Reactions  . Banana Nausea And Vomiting    Family History:  Family History  Problem Relation Age of Onset  . Leukemia Father 31       Due to agent orange, deceased  . Hearing loss Mother        Living  . Diabetes Brother   . Obesity Brother   . Healthy Brother   . Alcohol abuse Sister   . Colon cancer Neg Hx      Current Outpatient Medications:  .  acetaminophen (TYLENOL) 500 MG  tablet, Take 1,500 mg by mouth every 6 (six) hours as needed for mild pain., Disp: , Rfl:  .  cephALEXin (KEFLEX) 500 MG capsule, Take 1 capsule (500 mg total) by mouth 4 (four) times daily for 7 days., Disp: 28 capsule, Rfl: 0 .  ibuprofen (ADVIL) 200 MG tablet, Take 400 mg by mouth every 6 (six) hours as needed for mild pain., Disp: , Rfl:  .  rOPINIRole (REQUIP) 4 MG tablet, Take 1 tablet (4 mg total) by mouth at bedtime., Disp: 90 tablet, Rfl: 1 .  ALPRAZolam (XANAX) 0.25 MG tablet, Take 1 tablet (0.25 mg total) by mouth daily as needed for anxiety., Disp: 15 tablet, Rfl: 0 .  carbidopa-levodopa (SINEMET CR) 50-200 MG tablet, Take 1 tablet by mouth 2 (two) times daily. (Patient not taking: Reported on 10/07/2018), Disp: 180 tablet, Rfl: 3 .  rOPINIRole (REQUIP) 1 MG tablet, Take 1 tablet (1 mg total) by mouth 3 (three) times daily., Disp: 90 tablet, Rfl: 0  Review of Systems:  Constitutional: Denies fever, chills, diaphoresis, appetite change and fatigue.  HEENT: Denies photophobia, eye pain, redness, hearing loss, ear pain, congestion, sore throat, rhinorrhea, sneezing, mouth sores, trouble swallowing, neck pain, neck stiffness  and tinnitus.   Respiratory: Denies SOB, DOE, cough, chest tightness,  and wheezing.   Cardiovascular: Denies chest pain, palpitations and leg swelling.  Gastrointestinal: Denies nausea, vomiting, abdominal pain, diarrhea, constipation, blood in stool and abdominal distention.  Genitourinary: Denies dysuria, urgency, frequency, hematuria, flank pain and difficulty urinating.  Endocrine: Denies: hot or cold intolerance, sweats, changes in hair or nails, polyuria, polydipsia. Musculoskeletal: Denies myalgias, back pain, joint swelling, arthralgias and gait problem.  Skin: Denies pallor, rash and wound.  Neurological: Denies dizziness, seizures, syncope, weakness, light-headedness, numbness and headaches.  Hematological: Denies adenopathy. Easy bruising, personal or family  bleeding history  Psychiatric/Behavioral: Denies suicidal ideation, mood changes, confusion, nervousness, sleep disturbance and agitation    Physical Exam: Vitals:   10/07/18 0734  BP: 140/90  Pulse: 84  Temp: 99.4 F (37.4 C)  TempSrc: Oral  SpO2: 96%  Weight: 272 lb 4.8 oz (123.5 kg)    Body mass index is 41.4 kg/m.   Constitutional: NAD, calm, comfortable Eyes: PERRL, lids and conjunctivae normal, wears corrective lenses ENMT: Mucous membranes are moist.  Respiratory: clear to auscultation bilaterally, no wheezing, no crackles. Normal respiratory effort. No accessory muscle use.  Cardiovascular: Regular rate and rhythm, no murmurs / rubs / gallops. No extremity edema.  No carotid bruits.  Abdomen: no tenderness, no masses palpated. No hepatosplenomegaly. Bowel sounds positive.  Musculoskeletal: no clubbing / cyanosis. No joint deformity upper and lower extremities. Good ROM, no contractures. Normal muscle tone. I am unable to palpate DP or PT pulses bilaterally. Skin: pics of left lower leg:      Neurologic: CN 2-12 grossly intact. Sensation intact, DTR normal. Strength 5/5 in all 4.  Psychiatric: Normal judgment and insight. Alert and oriented x 3. Normal mood.    Impression and Plan:  Wound of left lower extremity, initial encounter  -By appearance, appears to be a venous stasis ulcer. -ABIs since I am unable to feel pulses in both feet. -Will refer to wound clinic. -Wound wrapped in xeroform gauze, no bleeding in office today.  Impaired Glucose Tolerance -A1c 6.1 in office today.  TOBACCO USER -I have discussed tobacco cessation with the patient.  I have counseled the patient regarding the negative impacts of continued tobacco use including but not limited to lung cancer, COPD, and cardiovascular disease.  I have discussed alternatives to tobacco and modalities that may help facilitate tobacco cessation including but not limited to biofeedback, hypnosis, and  medications.  Total time spent with tobacco counseling was 4 minutes. -He is not ready to quit yet. -Will continue to discuss at all future visits.  RESTLESS LEG SYNDROME -Refill requip today: he takes 4 mg at night and 0.5 mg in am (half a 1 mg tab).  Essential hypertension -BP up today to 140/90. -No meds yet, but will recheck at time of 6 week follow up for CPE: if still elevated then, start meds.  Gastroesophageal reflux disease without esophagitis -Well controlled off meds.  Morbid obesity ([place]) -Discussed healthy lifestyle, including increased physical activity and better food choices to promote weight loss.  Anxiety  -On Xanax 0.25 mg PRN. -Have agreed to no more than 15 tabs/month.    Patient Instructions  -Nice meeting you today!!  -Schedule follow up in 6 weeks for your physical. Please come in fasting that day.  -Referral to wound clinic and arterial ultrasound for your legs today.     Lelon Frohlich, MD [facility]

## 2018-10-07 NOTE — Patient Instructions (Signed)
-  Nice meeting you today!!  -Schedule follow up in 6 weeks for your physical. Please come in fasting that day.  -Referral to wound clinic and arterial ultrasound for your legs today.

## 2018-10-07 NOTE — Telephone Encounter (Signed)
The Requip 1 mg has different directions.  New Rx sent

## 2018-10-24 ENCOUNTER — Encounter (HOSPITAL_BASED_OUTPATIENT_CLINIC_OR_DEPARTMENT_OTHER): Payer: BLUE CROSS/BLUE SHIELD | Attending: Internal Medicine

## 2018-10-24 ENCOUNTER — Other Ambulatory Visit: Payer: Self-pay

## 2018-10-24 DIAGNOSIS — Z6839 Body mass index (BMI) 39.0-39.9, adult: Secondary | ICD-10-CM | POA: Insufficient documentation

## 2018-10-24 DIAGNOSIS — L97922 Non-pressure chronic ulcer of unspecified part of left lower leg with fat layer exposed: Secondary | ICD-10-CM | POA: Diagnosis not present

## 2018-10-24 DIAGNOSIS — Z96649 Presence of unspecified artificial hip joint: Secondary | ICD-10-CM | POA: Diagnosis not present

## 2018-10-24 DIAGNOSIS — F17200 Nicotine dependence, unspecified, uncomplicated: Secondary | ICD-10-CM | POA: Insufficient documentation

## 2018-10-24 DIAGNOSIS — G2581 Restless legs syndrome: Secondary | ICD-10-CM | POA: Insufficient documentation

## 2018-10-24 DIAGNOSIS — I87332 Chronic venous hypertension (idiopathic) with ulcer and inflammation of left lower extremity: Secondary | ICD-10-CM | POA: Insufficient documentation

## 2018-10-24 DIAGNOSIS — I10 Essential (primary) hypertension: Secondary | ICD-10-CM | POA: Diagnosis not present

## 2018-10-31 DIAGNOSIS — I87332 Chronic venous hypertension (idiopathic) with ulcer and inflammation of left lower extremity: Secondary | ICD-10-CM | POA: Diagnosis not present

## 2018-11-07 DIAGNOSIS — I87332 Chronic venous hypertension (idiopathic) with ulcer and inflammation of left lower extremity: Secondary | ICD-10-CM | POA: Diagnosis not present

## 2018-11-12 ENCOUNTER — Other Ambulatory Visit: Payer: Self-pay | Admitting: Internal Medicine

## 2018-11-12 DIAGNOSIS — F419 Anxiety disorder, unspecified: Secondary | ICD-10-CM

## 2018-11-13 ENCOUNTER — Other Ambulatory Visit: Payer: Self-pay

## 2018-11-13 ENCOUNTER — Encounter (HOSPITAL_BASED_OUTPATIENT_CLINIC_OR_DEPARTMENT_OTHER): Payer: BLUE CROSS/BLUE SHIELD | Attending: Internal Medicine

## 2018-11-13 DIAGNOSIS — R600 Localized edema: Secondary | ICD-10-CM

## 2018-11-13 DIAGNOSIS — I87332 Chronic venous hypertension (idiopathic) with ulcer and inflammation of left lower extremity: Secondary | ICD-10-CM | POA: Diagnosis present

## 2018-11-13 DIAGNOSIS — L97822 Non-pressure chronic ulcer of other part of left lower leg with fat layer exposed: Secondary | ICD-10-CM | POA: Insufficient documentation

## 2018-11-17 ENCOUNTER — Ambulatory Visit (HOSPITAL_COMMUNITY)
Admission: RE | Admit: 2018-11-17 | Discharge: 2018-11-17 | Disposition: A | Discharge status: Home / Self Care | Payer: BLUE CROSS/BLUE SHIELD | Source: Ambulatory Visit | Attending: Surgery | Admitting: Surgery

## 2018-11-17 ENCOUNTER — Ambulatory Visit (INDEPENDENT_AMBULATORY_CARE_PROVIDER_SITE_OTHER): Payer: BLUE CROSS/BLUE SHIELD | Admitting: Physician Assistant

## 2018-11-17 ENCOUNTER — Other Ambulatory Visit: Payer: Self-pay

## 2018-11-17 DIAGNOSIS — I83009 Varicose veins of unspecified lower extremity with ulcer of unspecified site: Secondary | ICD-10-CM | POA: Diagnosis not present

## 2018-11-17 DIAGNOSIS — L97322 Non-pressure chronic ulcer of left ankle with fat layer exposed: Secondary | ICD-10-CM | POA: Diagnosis not present

## 2018-11-17 DIAGNOSIS — I83023 Varicose veins of left lower extremity with ulcer of ankle: Secondary | ICD-10-CM | POA: Diagnosis not present

## 2018-11-17 DIAGNOSIS — R600 Localized edema: Secondary | ICD-10-CM | POA: Diagnosis not present

## 2018-11-17 DIAGNOSIS — L97909 Non-pressure chronic ulcer of unspecified part of unspecified lower leg with unspecified severity: Secondary | ICD-10-CM

## 2018-11-17 NOTE — Progress Notes (Signed)
Requested by:  Isaac Bliss, Rayford Halsted, MD [place],  [place] [address]  Reason for consultation: venous ulcer    History of Present Illness   Patrick [name] is a 53 y.o. (1965/10/24) male who presents with chief complaint: nonhealing ulcer LLE.  Patient notes having edema, varicosities, and stasis pigmentation changes starting several years ago.  Patient states his ulcer started about 3 months ago after bumping his leg up against a wooden pallet and has not been able to heal this wound since.  Most recently has been seen by the wound clinic where he receives wound care with weekly Unna boots.  The patient has had no history of DVT, no prior history of venous stasis ulcers.  There is no family history of venous disorders.  The patient has not used compression stockings in the past.  Past Medical History:  Diagnosis Date  . Arthritis   . Blood transfusion without reported diagnosis 1984   20+ units  . RLS (restless legs syndrome)   . Tobacco use     Past Surgical History:  Procedure Laterality Date  . arm surgery  1984   MVI  . CARDIAC SURGERY  1984   MVI  . CHOLECYSTECTOMY    . KIDNEY SURGERY  1984   MVI  . MANDIBLE FRACTURE SURGERY  1984   MVI  . TOTAL HIP ARTHROPLASTY Left 09/07/2016   Procedure: TOTAL HIP ARTHROPLASTY;  Surgeon: Earlie Server, MD;  Location: [facility];  Service: Orthopedics;  Laterality: Left;    Social History   Socioeconomic History  . Marital status: Single    Spouse name: Not on file  . Number of children: Not on file  . Years of education: Not on file  . Highest education level: Not on file  Occupational History  . Not on file  Social Needs  . Financial resource strain: Not on file  . Food insecurity    Worry: Not on file    Inability: Not on file  . Transportation needs    Medical: Not on file    Non-medical: Not on file  Tobacco Use  . Smoking status: Current Every Day Smoker    Packs/day: 0.75    Years: 35.00   Pack years: 26.25    Types: Cigarettes  . Smokeless tobacco: Never Used  . Tobacco comment: cut back from 1 pack to 1/2 pack day - 35x years  Substance and Sexual Activity  . Alcohol use: Yes    Alcohol/week: 0.0 standard drinks    Comment: twice a month  . Drug use: No  . Sexual activity: Not on file  Lifestyle  . Physical activity    Days per week: Not on file    Minutes per session: Not on file  . Stress: Not on file  Relationships  . Social Herbalist on phone: Not on file    Gets together: Not on file    Attends religious service: Not on file    Active member of club or organization: Not on file    Attends meetings of clubs or organizations: Not on file    Relationship status: Not on file  . Intimate partner violence    Fear of current or ex partner: Not on file    Emotionally abused: Not on file    Physically abused: Not on file    Forced sexual activity: Not on file  Other Topics Concern  . 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     Highest level of education:  High school   He lives alone.  He has one son.    Currently not working.    Family History  Problem Relation Age of Onset  . Leukemia Father 76       Due to agent orange, deceased  . Hearing loss Mother        Living  . Diabetes Brother   . Obesity Brother   . Healthy Brother   . Alcohol abuse Sister   . Colon cancer Neg Hx     Current Outpatient Medications  Medication Sig Dispense Refill  . acetaminophen (TYLENOL) 500 MG tablet Take 1,500 mg by mouth every 6 (six) hours as needed for mild pain.    Marland Kitchen ALPRAZolam (XANAX) 0.25 MG tablet TAKE 1 TABLET BY MOUTH ONCE DAILY AS NEEDED FOR ANXIETY 15 tablet 2  . ibuprofen (ADVIL) 200 MG tablet Take 400 mg by mouth every 6 (six) hours as needed for mild pain.    Marland Kitchen rOPINIRole (REQUIP) 1 MG tablet Take half tab daily as needed 90 tablet 0  . rOPINIRole (REQUIP) 4 MG tablet Take 1 tablet (4 mg total)  by mouth at bedtime. 90 tablet 1   No current facility-administered medications for this visit.     Allergies  Allergen Reactions  . Banana Nausea And Vomiting    REVIEW OF SYSTEMS (negative unless checked):   Cardiac:  []  Chest pain or chest pressure? []  Shortness of breath upon activity? []  Shortness of breath when lying flat? []  Irregular heart rhythm?  Vascular:  []  Pain in calf, thigh, or hip brought on by walking? []  Pain in feet at night that wakes you up from your sleep? []  Blood clot in your veins? [x]  Leg swelling?  Pulmonary:  []  Oxygen at home? []  Productive cough? []  Wheezing?  Neurologic:  []  Sudden weakness in arms or legs? []  Sudden numbness in arms or legs? []  Sudden onset of difficult speaking or slurred speech? []  Temporary loss of vision in one eye? []  Problems with dizziness?  Gastrointestinal:  []  Blood in stool? []  Vomited blood?  Genitourinary:  []  Burning when urinating? []  Blood in urine?  Psychiatric:  []  Major depression  Hematologic:  []  Bleeding problems? []  Problems with blood clotting?  Dermatologic:  []  Rashes or ulcers?  Constitutional:  []  Fever or chills?  Ear/Nose/Throat:  []  Change in hearing? []  Nose bleeds? []  Sore throat?  Musculoskeletal:  []  Back pain? []  Joint pain? []  Muscle pain?   Physical Examination     Vitals:   11/17/18 1440  BP: (!) 157/96  Pulse: 87  Resp: 14  Temp: 98.1 F (36.7 C)  TempSrc: Temporal  SpO2: 98%  Weight: 260 lb 9.6 oz (118.2 kg)  Height: 5\' 8"  (1.727 m)   Body mass index is 39.62 kg/m.  General Alert, O x 3, WD, NAD  Head Nam[place]/AT,    Neck Supple, mid-line trachea,    Pulmonary Sym exp, good B air movt  Cardiac RRR, Nl S1, S2  Vascular Vessel Right Left  Radial Palpable Palpable  DP Palpable Palpable    Gastro- intestinal soft, non-distended, non-tender to palpation,   Musculo- skeletal M/S 5/5 throughout  , Extremities without ischemic changes  ,  Pitting edema present: LLE, Varicosities present: L proximal calf, No Lipodermatosclerosis present, lateral L leg shallow ulceration with clear drainage; circumferential stasis pigmentation lower left leg  Neurologic Cranial nerves 2-12 intact  Dermatologic See M/S exam for extremity exam, No rashes otherwise noted  Lymphatic  Palpable lymph nodes: None    Non-invasive Vascular Imaging   BLE Venous Insufficiency Duplex (11/17/18):   RLE:   Negative for DVT and SVT,   no GSV reflux,   no SSV reflux,  CFV deep venous reflux  LLE:  Negative for DVT and SVT,   Positive for GSV reflux,   proximal SSV reflux,  CFV deep venous reflux   Medical Decision Making   Patrick [name] is a 53 y.o. male who presents with: LLE chronic venous insufficiency (C6), varicose veins with complications   Left greater saphenous vein with reflux noted throughout its course  Based on the patient's history and examination, I recommend: Continued use of Unna boots with wound clinic for left lower extremity.  He will follow-up with Dr. Oneida Alar to further discuss greater saphenous vein laser ablation and possible stab phlebectomy  Thank you for allowing [place] to participate in this patient's care.   Dagoberto Ligas PA-C [facility] Office: [phone]  11/17/2018, 3:27 PM  Clinic MD: Dr. Trula Slade

## 2018-11-18 DIAGNOSIS — I87332 Chronic venous hypertension (idiopathic) with ulcer and inflammation of left lower extremity: Secondary | ICD-10-CM | POA: Diagnosis not present

## 2018-11-19 ENCOUNTER — Ambulatory Visit (INDEPENDENT_AMBULATORY_CARE_PROVIDER_SITE_OTHER): Payer: BLUE CROSS/BLUE SHIELD | Admitting: Internal Medicine

## 2018-11-19 ENCOUNTER — Encounter: Payer: Self-pay | Admitting: Internal Medicine

## 2018-11-19 ENCOUNTER — Other Ambulatory Visit: Payer: Self-pay

## 2018-11-19 VITALS — BP 160/98 | HR 79 | Temp 98.5°F | Ht 68.0 in | Wt 262.1 lb

## 2018-11-19 DIAGNOSIS — F17200 Nicotine dependence, unspecified, uncomplicated: Secondary | ICD-10-CM | POA: Diagnosis not present

## 2018-11-19 DIAGNOSIS — I10 Essential (primary) hypertension: Secondary | ICD-10-CM

## 2018-11-19 DIAGNOSIS — Z0000 Encounter for general adult medical examination without abnormal findings: Secondary | ICD-10-CM

## 2018-11-19 DIAGNOSIS — Z1211 Encounter for screening for malignant neoplasm of colon: Secondary | ICD-10-CM | POA: Diagnosis not present

## 2018-11-19 DIAGNOSIS — G2581 Restless legs syndrome: Secondary | ICD-10-CM

## 2018-11-19 DIAGNOSIS — K219 Gastro-esophageal reflux disease without esophagitis: Secondary | ICD-10-CM

## 2018-11-19 DIAGNOSIS — L97329 Non-pressure chronic ulcer of left ankle with unspecified severity: Secondary | ICD-10-CM

## 2018-11-19 DIAGNOSIS — R7302 Impaired glucose tolerance (oral): Secondary | ICD-10-CM | POA: Diagnosis not present

## 2018-11-19 DIAGNOSIS — I83023 Varicose veins of left lower extremity with ulcer of ankle: Secondary | ICD-10-CM

## 2018-11-19 LAB — CBC WITH DIFFERENTIAL/PLATELET
Basophils Absolute: 0 10*3/uL (ref 0.0–0.1)
Basophils Relative: 0.4 % (ref 0.0–3.0)
Eosinophils Absolute: 0.1 10*3/uL (ref 0.0–0.7)
Eosinophils Relative: 1.9 % (ref 0.0–5.0)
HCT: 39.2 % (ref 39.0–52.0)
Hemoglobin: 12.8 g/dL — ABNORMAL LOW (ref 13.0–17.0)
Lymphocytes Relative: 29.7 % (ref 12.0–46.0)
Lymphs Abs: 2.2 10*3/uL (ref 0.7–4.0)
MCHC: 32.6 g/dL (ref 30.0–36.0)
MCV: 95.3 fl (ref 78.0–100.0)
Monocytes Absolute: 0.7 10*3/uL (ref 0.1–1.0)
Monocytes Relative: 10 % (ref 3.0–12.0)
Neutro Abs: 4.2 10*3/uL (ref 1.4–7.7)
Neutrophils Relative %: 58 % (ref 43.0–77.0)
Platelets: 264 10*3/uL (ref 150.0–400.0)
RBC: 4.12 Mil/uL — ABNORMAL LOW (ref 4.22–5.81)
RDW: 13.7 % (ref 11.5–15.5)
WBC: 7.3 10*3/uL (ref 4.0–10.5)

## 2018-11-19 LAB — TSH: TSH: 1.77 u[IU]/mL (ref 0.35–4.50)

## 2018-11-19 LAB — COMPREHENSIVE METABOLIC PANEL
ALT: 12 U/L (ref 0–53)
AST: 14 U/L (ref 0–37)
Albumin: 3.9 g/dL (ref 3.5–5.2)
Alkaline Phosphatase: 63 U/L (ref 39–117)
BUN: 18 mg/dL (ref 6–23)
CO2: 27 mEq/L (ref 19–32)
Calcium: 9.2 mg/dL (ref 8.4–10.5)
Chloride: 104 mEq/L (ref 96–112)
Creatinine, Ser: 0.77 mg/dL (ref 0.40–1.50)
GFR: 104.77 mL/min (ref >60.00)
Glucose, Bld: 102 mg/dL — ABNORMAL HIGH (ref 70–99)
Potassium: 5.2 mEq/L — ABNORMAL HIGH (ref 3.5–5.1)
Sodium: 139 mEq/L (ref 135–145)
Total Bilirubin: 0.5 mg/dL (ref 0.2–1.2)
Total Protein: 7.3 g/dL (ref 6.0–8.3)

## 2018-11-19 LAB — VITAMIN B12: Vitamin B-12: 562 pg/mL (ref 211–911)

## 2018-11-19 LAB — LIPID PANEL
Cholesterol: 127 mg/dL (ref 0–200)
HDL: 50.9 mg/dL (ref >39.00)
LDL Cholesterol: 64 mg/dL (ref 0–99)
NonHDL: 76.31
Total CHOL/HDL Ratio: 2
Triglycerides: 63 mg/dL (ref 0.0–149.0)
VLDL: 12.6 mg/dL (ref 0.0–40.0)

## 2018-11-19 LAB — HEMOGLOBIN A1C: Hgb A1c MFr Bld: 5.9 % (ref 4.6–6.5)

## 2018-11-19 LAB — VITAMIN D 25 HYDROXY (VIT D DEFICIENCY, FRACTURES): VITD: 34.53 ng/mL (ref 30.00–100.00)

## 2018-11-19 NOTE — Progress Notes (Signed)
Established Patient Office Visit     CC/Reason for Visit: Annual preventive exam  HPI: Patrick [name] is a 53 y.o. male who is coming in today for the above mentioned reasons. Past Medical History is significant for: Morbid obesity, hypertension (not on medications), impaired glucose tolerance, GERD not on medications, restless leg syndrome and tobacco abuse.  He also has a left lower extremity venous stasis ulcer and has been attending wound clinic, tells me that wound is starting to heal, he has seen vascular surgery and they are planning on some type of procedure in the short-term future.  He has no acute complaints today.  He is still smoking a little less than half pack a day.  He is not interested in any vaccinations, will initiate referral to GI for screening colonoscopy, he has routine eye care but no dental care.   Past Medical/Surgical History: Past Medical History:  Diagnosis Date  . Arthritis   . Blood transfusion without reported diagnosis 1984   20+ units  . RLS (restless legs syndrome)   . Tobacco use     Past Surgical History:  Procedure Laterality Date  . arm surgery  1984   MVI  . CARDIAC SURGERY  1984   MVI  . CHOLECYSTECTOMY    . KIDNEY SURGERY  1984   MVI  . MANDIBLE FRACTURE SURGERY  1984   MVI  . TOTAL HIP ARTHROPLASTY Left 09/07/2016   Procedure: TOTAL HIP ARTHROPLASTY;  Surgeon: Earlie Server, MD;  Location: [facility];  Service: Orthopedics;  Laterality: Left;    Social History:  reports that he has been smoking cigarettes. He has a 26.25 pack-year smoking history. He has never used smokeless tobacco. He reports current alcohol use. He reports that he does not use drugs.  Allergies: Allergies  Allergen Reactions  . Banana Nausea And Vomiting    Family History:  Family History  Problem Relation Age of Onset  . Leukemia Father 34       Due to agent orange, deceased  . Hearing loss Mother        Living  . Diabetes Brother   . Obesity Brother    . Healthy Brother   . Alcohol abuse Sister   . Colon cancer Neg Hx      Current Outpatient Medications:  .  acetaminophen (TYLENOL) 500 MG tablet, Take 1,500 mg by mouth every 6 (six) hours as needed for mild pain., Disp: , Rfl:  .  ALPRAZolam (XANAX) 0.25 MG tablet, TAKE 1 TABLET BY MOUTH ONCE DAILY AS NEEDED FOR ANXIETY, Disp: 15 tablet, Rfl: 2 .  ibuprofen (ADVIL) 200 MG tablet, Take 400 mg by mouth every 6 (six) hours as needed for mild pain., Disp: , Rfl:  .  rOPINIRole (REQUIP) 1 MG tablet, Take half tab daily as needed, Disp: 90 tablet, Rfl: 0 .  rOPINIRole (REQUIP) 4 MG tablet, Take 1 tablet (4 mg total) by mouth at bedtime., Disp: 90 tablet, Rfl: 1  Review of Systems:  Constitutional: Denies fever, chills, diaphoresis, appetite change and fatigue.  HEENT: Denies photophobia, eye pain, redness, hearing loss, ear pain, congestion, sore throat, rhinorrhea, sneezing, mouth sores, trouble swallowing, neck pain, neck stiffness and tinnitus.   Respiratory: Denies SOB, DOE, cough, chest tightness,  and wheezing.   Cardiovascular: Denies chest pain, palpitations and leg swelling.  Gastrointestinal: Denies nausea, vomiting, abdominal pain, diarrhea, constipation, blood in stool and abdominal distention.  Genitourinary: Denies dysuria, urgency, frequency, hematuria, flank pain and  difficulty urinating.  Endocrine: Denies: hot or cold intolerance, sweats, changes in hair or nails, polyuria, polydipsia. Musculoskeletal: Denies myalgias, back pain, joint swelling, arthralgias and gait problem.  Skin: Denies pallor, rash and wound.  Neurological: Denies dizziness, seizures, syncope, weakness, light-headedness, numbness and headaches.  Hematological: Denies adenopathy. Easy bruising, personal or family bleeding history  Psychiatric/Behavioral: Denies suicidal ideation, mood changes, confusion, nervousness, sleep disturbance and agitation    Physical Exam: Vitals:   11/19/18 0720  BP: (!)  160/98  Pulse: 79  Temp: 98.5 F (36.9 C)  TempSrc: Temporal  SpO2: 96%  Weight: 262 lb 1.6 oz (118.9 kg)  Height: '5\' 8"'  (1.727 m)    Body mass index is 39.85 kg/m.   Constitutional: NAD, calm, comfortable Eyes: PERRL, lids and conjunctivae normal, wears corrective lenses ENMT: Mucous membranes are moist. Tympanic membrane is pearly white, no erythema or bulging. Neck: normal, supple, no masses, no thyromegaly Respiratory: clear to auscultation bilaterally, no wheezing, no crackles. Normal respiratory effort. No accessory muscle use.  Cardiovascular: Regular rate and rhythm, no murmurs / rubs / gallops. No extremity edema.  No carotid bruits.  Left leg is wrapped in compressive bandage Abdomen: Obese, no tenderness, no masses palpated. No hepatosplenomegaly. Bowel sounds positive.  Musculoskeletal: no clubbing / cyanosis. No joint deformity upper and lower extremities. Good ROM, no contractures. Normal muscle tone.  Skin: no rashes, lesions, ulcers. No induration Neurologic: CN 2-12 grossly intact. Sensation intact, DTR normal. Strength 5/5 in all 4.  Psychiatric: Normal judgment and insight. Alert and oriented x 3. Normal mood.    Impression and Plan:  Encounter for preventive health examination -He has a eye care. -Have advised routine dental care. -His Tdap is up-to-date, he is refusing flu and shingles. -Have discussed healthy lifestyle in great detail today. -Labs to be updated today. -Will refer to GI for screening colonoscopy as he is past due.  TOBACCO USER -He is not ready to quit smoking yet. -Have discussed risks in great detail.  RESTLESS LEG SYNDROME -Continue twice daily Requip.  Essential hypertension -Blood pressure is uncontrolled today, he is hesitant to start medications. -He will do ambulatory blood pressure monitoring and contact me once he has 2 weeks worth of data to determine if treatment is necessary.  I suspect it will be.  Gastroesophageal  reflux disease without esophagitis -Well-controlled, not on medications  Impaired glucose tolerance -Check A1c today.  Morbid obesity ([place]) -Discussed healthy lifestyle, including increased physical activity and better food choices to promote weight loss.  Venous stasis ulcer of ankle, left ([place]) -Attending wound clinic and seeing vascular surgery.    Patient Instructions  -Nice seeing you today!!  -Lab work today; will notify you once results are available.  -Check blood pressure at home 2-3 times a week and bring those numbers in to your next visit.  -Let's continue to work on smoking cessation.  -Schedule follow up in 4 months.   Preventive Care 63-65 Years Old, Male Preventive care refers to lifestyle choices and visits with your health care provider that can promote health and wellness. This includes:  A yearly physical exam. This is also called an annual well check.  Regular dental and eye exams.  Immunizations.  Screening for certain conditions.  Healthy lifestyle choices, such as eating a healthy diet, getting regular exercise, not using drugs or products that contain nicotine and tobacco, and limiting alcohol use. What can I expect for my preventive care visit? Physical exam Your health care provider will  check:  Height and weight. These may be used to calculate body mass index (BMI), which is a measurement that tells if you are at a healthy weight.  Heart rate and blood pressure.  Your skin for abnormal spots. Counseling Your health care provider may ask you questions about:  Alcohol, tobacco, and drug use.  Emotional well-being.  Home and relationship well-being.  Sexual activity.  Eating habits.  Work and work Statistician. What immunizations do I need?  Influenza (flu) vaccine  This is recommended every year. Tetanus, diphtheria, and pertussis (Tdap) vaccine  You may need a Td booster every 10 years. Varicella (chickenpox) vaccine   You may need this vaccine if you have not already been vaccinated. Zoster (shingles) vaccine  You may need this after age 37. Measles, mumps, and rubella (MMR) vaccine  You may need at least one dose of MMR if you were born in 1957 or later. You may also need a second dose. Pneumococcal conjugate (PCV13) vaccine  You may need this if you have certain conditions and were not previously vaccinated. Pneumococcal polysaccharide (PPSV23) vaccine  You may need one or two doses if you smoke cigarettes or if you have certain conditions. Meningococcal conjugate (MenACWY) vaccine  You may need this if you have certain conditions. Hepatitis A vaccine  You may need this if you have certain conditions or if you travel or work in places where you may be exposed to hepatitis A. Hepatitis B vaccine  You may need this if you have certain conditions or if you travel or work in places where you may be exposed to hepatitis B. Haemophilus influenzae type b (Hib) vaccine  You may need this if you have certain risk factors. Human papillomavirus (HPV) vaccine  If recommended by your health care provider, you may need three doses over 6 months. You may receive vaccines as individual doses or as more than one vaccine together in one shot (combination vaccines). Talk with your health care provider about the risks and benefits of combination vaccines. What tests do I need? Blood tests  Lipid and cholesterol levels. These may be checked every 5 years, or more frequently if you are over 6 years old.  Hepatitis C test.  Hepatitis B test. Screening  Lung cancer screening. You may have this screening every year starting at age 36 if you have a 30-pack-year history of smoking and currently smoke or have quit within the past 15 years.  Prostate cancer screening. Recommendations will vary depending on your family history and other risks.  Colorectal cancer screening. All adults should have this screening  starting at age 5 and continuing until age 73. Your health care provider may recommend screening at age 44 if you are at increased risk. You will have tests every 1-10 years, depending on your results and the type of screening test.  Diabetes screening. This is done by checking your blood sugar (glucose) after you have not eaten for a while (fasting). You may have this done every 1-3 years.  Sexually transmitted disease (STD) testing. Follow these instructions at home: Eating and drinking  Eat a diet that includes fresh fruits and vegetables, whole grains, lean protein, and low-fat dairy products.  Take vitamin and mineral supplements as recommended by your health care provider.  Do not drink alcohol if your health care provider tells you not to drink.  If you drink alcohol: ? Limit how much you have to 0-2 drinks a day. ? Be aware of how much alcohol  is in your drink. In the [place] one drink equals one 12 oz bottle of beer (355 mL), one 5 oz glass of wine (148 mL), or one 1 oz glass of hard liquor (44 mL). Lifestyle  Take daily care of your teeth and gums.  Stay active. Exercise for at least 30 minutes on 5 or more days each week.  Do not use any products that contain nicotine or tobacco, such as cigarettes, e-cigarettes, and chewing tobacco. If you need help quitting, ask your health care provider.  If you are sexually active, practice safe sex. Use a condom or other form of protection to prevent STIs (sexually transmitted infections).  Talk with your health care provider about taking a low-dose aspirin every day starting at age 16. What's next?  Go to your health care provider once a year for a well check visit.  Ask your health care provider how often you should have your eyes and teeth checked.  Stay up to date on all vaccines. This information is not intended to replace advice given to you by your health care provider. Make sure you discuss any questions you have with your  health care provider. Document Released: 04/22/2015 Document Revised: 03/20/2018 Document Reviewed: 03/20/2018 Elsevier Patient Education  2020 [place], [place] [facility]

## 2018-11-19 NOTE — Patient Instructions (Signed)
-Nice seeing you today!!  -Lab work today; will notify you once results are available.  -Check blood pressure at home 2-3 times a week and bring those numbers in to your next visit.  -Let's continue to work on smoking cessation.  -Schedule follow up in 4 months.   Preventive Care 69-55 Years Old, Male Preventive care refers to lifestyle choices and visits with your health care provider that can promote health and wellness. This includes:  A yearly physical exam. This is also called an annual well check.  Regular dental and eye exams.  Immunizations.  Screening for certain conditions.  Healthy lifestyle choices, such as eating a healthy diet, getting regular exercise, not using drugs or products that contain nicotine and tobacco, and limiting alcohol use. What can I expect for my preventive care visit? Physical exam Your health care provider will check:  Height and weight. These may be used to calculate body mass index (BMI), which is a measurement that tells if you are at a healthy weight.  Heart rate and blood pressure.  Your skin for abnormal spots. Counseling Your health care provider may ask you questions about:  Alcohol, tobacco, and drug use.  Emotional well-being.  Home and relationship well-being.  Sexual activity.  Eating habits.  Work and work Statistician. What immunizations do I need?  Influenza (flu) vaccine  This is recommended every year. Tetanus, diphtheria, and pertussis (Tdap) vaccine  You may need a Td booster every 10 years. Varicella (chickenpox) vaccine  You may need this vaccine if you have not already been vaccinated. Zoster (shingles) vaccine  You may need this after age 63. Measles, mumps, and rubella (MMR) vaccine  You may need at least one dose of MMR if you were born in 1957 or later. You may also need a second dose. Pneumococcal conjugate (PCV13) vaccine  You may need this if you have certain conditions and were not  previously vaccinated. Pneumococcal polysaccharide (PPSV23) vaccine  You may need one or two doses if you smoke cigarettes or if you have certain conditions. Meningococcal conjugate (MenACWY) vaccine  You may need this if you have certain conditions. Hepatitis A vaccine  You may need this if you have certain conditions or if you travel or work in places where you may be exposed to hepatitis A. Hepatitis B vaccine  You may need this if you have certain conditions or if you travel or work in places where you may be exposed to hepatitis B. Haemophilus influenzae type b (Hib) vaccine  You may need this if you have certain risk factors. Human papillomavirus (HPV) vaccine  If recommended by your health care provider, you may need three doses over 6 months. You may receive vaccines as individual doses or as more than one vaccine together in one shot (combination vaccines). Talk with your health care provider about the risks and benefits of combination vaccines. What tests do I need? Blood tests  Lipid and cholesterol levels. These may be checked every 5 years, or more frequently if you are over 78 years old.  Hepatitis C test.  Hepatitis B test. Screening  Lung cancer screening. You may have this screening every year starting at age 46 if you have a 30-pack-year history of smoking and currently smoke or have quit within the past 15 years.  Prostate cancer screening. Recommendations will vary depending on your family history and other risks.  Colorectal cancer screening. All adults should have this screening starting at age 20 and continuing until age  75. Your health care provider may recommend screening at age 31 if you are at increased risk. You will have tests every 1-10 years, depending on your results and the type of screening test.  Diabetes screening. This is done by checking your blood sugar (glucose) after you have not eaten for a while (fasting). You may have this done every 1-3  years.  Sexually transmitted disease (STD) testing. Follow these instructions at home: Eating and drinking  Eat a diet that includes fresh fruits and vegetables, whole grains, lean protein, and low-fat dairy products.  Take vitamin and mineral supplements as recommended by your health care provider.  Do not drink alcohol if your health care provider tells you not to drink.  If you drink alcohol: ? Limit how much you have to 0-2 drinks a day. ? Be aware of how much alcohol is in your drink. In the [place] one drink equals one 12 oz bottle of beer (355 mL), one 5 oz glass of wine (148 mL), or one 1 oz glass of hard liquor (44 mL). Lifestyle  Take daily care of your teeth and gums.  Stay active. Exercise for at least 30 minutes on 5 or more days each week.  Do not use any products that contain nicotine or tobacco, such as cigarettes, e-cigarettes, and chewing tobacco. If you need help quitting, ask your health care provider.  If you are sexually active, practice safe sex. Use a condom or other form of protection to prevent STIs (sexually transmitted infections).  Talk with your health care provider about taking a low-dose aspirin every day starting at age 91. What's next?  Go to your health care provider once a year for a well check visit.  Ask your health care provider how often you should have your eyes and teeth checked.  Stay up to date on all vaccines. This information is not intended to replace advice given to you by your health care provider. Make sure you discuss any questions you have with your health care provider. Document Released: 04/22/2015 Document Revised: 03/20/2018 Document Reviewed: 03/20/2018 Elsevier Patient Education  2020 [employer].

## 2018-11-20 ENCOUNTER — Encounter: Payer: Self-pay | Admitting: Vascular Surgery

## 2018-11-20 ENCOUNTER — Ambulatory Visit (INDEPENDENT_AMBULATORY_CARE_PROVIDER_SITE_OTHER): Payer: BLUE CROSS/BLUE SHIELD | Admitting: Vascular Surgery

## 2018-11-20 VITALS — BP 139/95 | HR 90 | Temp 97.7°F | Resp 20 | Ht 68.0 in | Wt 262.0 lb

## 2018-11-20 DIAGNOSIS — L97909 Non-pressure chronic ulcer of unspecified part of unspecified lower leg with unspecified severity: Secondary | ICD-10-CM | POA: Diagnosis not present

## 2018-11-20 DIAGNOSIS — I83009 Varicose veins of unspecified lower extremity with ulcer of unspecified site: Secondary | ICD-10-CM

## 2018-11-20 NOTE — Progress Notes (Signed)
Patient is a 53 year old male with a 73-month history of a left leg wound.  He is currently being followed at the wound center.  He has been treating it with compression dressings.  He saw our PA [facility] August 2020.  At that point he recommended potentially a laser ablation.  Patient had a venous reflux study at that point which showed a 7 to 10 mm vein with diffuse reflux throughout the left greater saphenous vein.  There was no evidence of DVT.  Patient has continued to be compliant going to the wound center having multiple debridements and compression dressings.  He states the wound may have gotten a little bit smaller but not noticeably.  Past Medical History:  Diagnosis Date  . Arthritis   . Blood transfusion without reported diagnosis 1984   20+ units  . RLS (restless legs syndrome)   . Tobacco use     Past Surgical History:  Procedure Laterality Date  . arm surgery  1984   MVI  . CARDIAC SURGERY  1984   MVI  . CHOLECYSTECTOMY    . KIDNEY SURGERY  1984   MVI  . MANDIBLE FRACTURE SURGERY  1984   MVI  . TOTAL HIP ARTHROPLASTY Left 09/07/2016   Procedure: TOTAL HIP ARTHROPLASTY;  Surgeon: Earlie Server, MD;  Location: [facility];  Service: Orthopedics;  Laterality: Left;    Current Outpatient Medications on File Prior to Visit  Medication Sig Dispense Refill  . acetaminophen (TYLENOL) 500 MG tablet Take 1,500 mg by mouth every 6 (six) hours as needed for mild pain.    Marland Kitchen ALPRAZolam (XANAX) 0.25 MG tablet TAKE 1 TABLET BY MOUTH ONCE DAILY AS NEEDED FOR ANXIETY 15 tablet 2  . ibuprofen (ADVIL) 200 MG tablet Take 400 mg by mouth every 6 (six) hours as needed for mild pain.    Marland Kitchen rOPINIRole (REQUIP) 1 MG tablet Take half tab daily as needed 90 tablet 0  . rOPINIRole (REQUIP) 4 MG tablet Take 1 tablet (4 mg total) by mouth at bedtime. 90 tablet 1   No current facility-administered medications on file prior to visit.     Allergies  Allergen Reactions  . Banana Nausea And Vomiting   Review of systems: He has no shortness of breath.  He has no chest pain.  Physical exam:  Vitals:   11/20/18 1340  BP: (!) 139/95  Pulse: 90  Resp: 20  Temp: 97.7 F (36.5 C)  SpO2: 95%  Weight: 262 lb (118.8 kg)  Height: 5\' 8"  (1.727 m)    Extremities: 2+ dorsalis pedis pulses bilaterally  Skin: Images depicted below large ulceration lateral aspect left leg and diffuse hemosiderin staining across the gaiter area  I repeated portions of the patient's left leg ultrasound today which shows a diffusely enlarged left greater saphenous vein with no significant tortuosity.        Assessment: Venous stasis ulcers CEAP class VI  Plan: Laser ablation left greater saphenous vein in the near future.  Our vein scheduling nurse was out today as soon as we can find a spot on the schedule for him with preapproval we will get this performed for him  Ruta Hinds, MD [facility]: [phone] Pager: [phone]

## 2018-11-21 ENCOUNTER — Encounter: Payer: Self-pay | Admitting: Gastroenterology

## 2018-11-25 DIAGNOSIS — I87332 Chronic venous hypertension (idiopathic) with ulcer and inflammation of left lower extremity: Secondary | ICD-10-CM | POA: Diagnosis not present

## 2018-12-05 DIAGNOSIS — I87332 Chronic venous hypertension (idiopathic) with ulcer and inflammation of left lower extremity: Secondary | ICD-10-CM | POA: Diagnosis not present

## 2018-12-12 ENCOUNTER — Encounter (HOSPITAL_BASED_OUTPATIENT_CLINIC_OR_DEPARTMENT_OTHER): Payer: BLUE CROSS/BLUE SHIELD

## 2018-12-18 ENCOUNTER — Other Ambulatory Visit: Payer: Self-pay | Admitting: Internal Medicine

## 2018-12-18 DIAGNOSIS — E875 Hyperkalemia: Secondary | ICD-10-CM

## 2018-12-19 ENCOUNTER — Other Ambulatory Visit: Payer: Self-pay

## 2018-12-19 ENCOUNTER — Ambulatory Visit (AMBULATORY_SURGERY_CENTER): Payer: Self-pay | Admitting: *Deleted

## 2018-12-19 VITALS — Temp 96.2°F | Ht 68.0 in | Wt 263.4 lb

## 2018-12-19 DIAGNOSIS — Z1211 Encounter for screening for malignant neoplasm of colon: Secondary | ICD-10-CM

## 2018-12-19 MED ORDER — NA SULFATE-K SULFATE-MG SULF 17.5-3.13-1.6 GM/177ML PO SOLN: ORAL | 0 refills | Status: DC

## 2018-12-19 NOTE — Progress Notes (Signed)
Patient is here in-person for PV. Patient denies any allergies to eggs or soy. Patient denies any problems with anesthesia/sedation. Patient denies any oxygen use at home. Patient denies taking any diet/weight loss medications or blood thinners. EMMI education assisgned to patient on colonoscopy, this was explained and instructions given to patient.Pt is aware that care partner will wait in the car during procedure; if they feel like they will be too hot to wait in the car; they may wait in the lobby.  We want them to wear a mask (we do not have any that we can provide them), practice social distancing, and we will check their temperatures when they get here.  I did remind patient that their care partner needs to stay in the parking lot the entire time. Pt will wear mask into building. suprep $15 off coupon given to pt.

## 2018-12-23 ENCOUNTER — Encounter: Payer: Self-pay | Admitting: Gastroenterology

## 2018-12-30 ENCOUNTER — Encounter: Payer: Self-pay | Admitting: Family Medicine

## 2018-12-30 ENCOUNTER — Telehealth (INDEPENDENT_AMBULATORY_CARE_PROVIDER_SITE_OTHER): Payer: BLUE CROSS/BLUE SHIELD | Admitting: Family Medicine

## 2018-12-30 ENCOUNTER — Other Ambulatory Visit: Payer: Self-pay

## 2018-12-30 VITALS — Wt 270.0 lb

## 2018-12-30 DIAGNOSIS — F439 Reaction to severe stress, unspecified: Secondary | ICD-10-CM

## 2018-12-30 NOTE — Progress Notes (Signed)
Virtual Visit via Video Note  I connected with Patrick [name]  on 12/30/18 at 12:40 PM EDT by a video enabled telemedicine application and verified that I am speaking with the correct person using two identifiers.  Location patient: home Location provider:work or home office Persons participating in the virtual visit: patient, provider, good friend  I discussed the limitations of evaluation and management by telemedicine and the availability of in person appointments. The patient expressed understanding and agreed to proceed.   HPI:  Acute visit for Anxiety: -has a lot of stress at work and has had increased anxiety recently, worse the last few days, reports can't return to work  -has remote hx of loss of best friend in a car accident, has been struggling with memories of that recently too -symptoms include depressed mood, anger issues, generalized worry, sometimes feel worthless, feels overwhelmed -he is working with HR -denies any plan or thoughts of suicide or harm to others -he denies any long term issues with depression or anxiety - reports this has all really happened in the last 3 days -he has RLS and PCP gave him xanax for this - reports it was not really for anxiety and did not really have issue or take medications for any mood issues in the past -wants FMLA for a week or more, he also is requesting an in person visit with his PCP for his RLS - feels this has worsened and feels the neurologist he saw in the past about this did not listen, want labs for this -friend is with him and joins the conversation, feels safe   ROS: See pertinent positives and negatives per HPI.  Past Medical History:  Diagnosis Date  . Arthritis   . Blood transfusion without reported diagnosis 1984   20+ units  . RLS (restless legs syndrome)   . Tobacco use     Past Surgical History:  Procedure Laterality Date  . arm surgery  1984   MVI  . CARDIAC SURGERY  1984   MVI  . CHOLECYSTECTOMY  ?2007  .  KIDNEY SURGERY  1984   MVI  . MANDIBLE FRACTURE SURGERY  1984   MVI  . TOTAL HIP ARTHROPLASTY Left 09/07/2016   Procedure: TOTAL HIP ARTHROPLASTY;  Surgeon: Earlie Server, MD;  Location: [facility];  Service: Orthopedics;  Laterality: Left;    Family History  Problem Relation Age of Onset  . Leukemia Father 62       Due to agent orange, deceased  . Hearing loss Mother        Living  . Diabetes Brother   . Obesity Brother   . Healthy Brother   . Alcohol abuse Sister   . Colon cancer Neg Hx   . Esophageal cancer Neg Hx   . Stomach cancer Neg Hx   . Rectal cancer Neg Hx   . Colon polyps Neg Hx     SOCIAL HX: see hpi   Current Outpatient Medications:  .  acetaminophen (TYLENOL) 500 MG tablet, Take 1,500 mg by mouth every 6 (six) hours as needed for mild pain., Disp: , Rfl:  .  ALPRAZolam (XANAX) 0.25 MG tablet, TAKE 1 TABLET BY MOUTH ONCE DAILY AS NEEDED FOR ANXIETY, Disp: 15 tablet, Rfl: 2 .  Glucosamine-Chondroitin (OSTEO BI-FLEX REGULAR STRENGTH PO), Take by mouth., Disp: , Rfl:  .  ibuprofen (ADVIL) 200 MG tablet, Take 400 mg by mouth every 6 (six) hours as needed for mild pain., Disp: , Rfl:  .  magnesium oxide (  MAG-OX) 400 MG tablet, Take 400 mg by mouth daily., Disp: , Rfl:  .  Misc Natural Products (FOCUSED MIND PO), Take by mouth., Disp: , Rfl:  .  Multiple Vitamin (ONE-A-DAY MENS PO), Take by mouth., Disp: , Rfl:  .  Na Sulfate-K Sulfate-Mg Sulf 17.5-3.13-1.6 GM/177ML SOLN, Suprep (no substitutions)-TAKE AS DIRECTED., Disp: 354 mL, Rfl: 0 .  Omega-3 Fatty Acids (FISH OIL PO), Take by mouth., Disp: , Rfl:  .  rOPINIRole (REQUIP) 1 MG tablet, Take half tab daily as needed, Disp: 90 tablet, Rfl: 0 .  rOPINIRole (REQUIP) 4 MG tablet, Take 1 tablet (4 mg total) by mouth at bedtime., Disp: 90 tablet, Rfl: 1 .  UNABLE TO FIND, Med Name: Restful leg OTC Hyland's, Disp: , Rfl:   EXAM:  VITALS per patient if applicable:none  GENERAL: alert, oriented HEENT: atraumatic,  conjunttiva clear, no obvious abnormalities on inspection of external nose and ears  NECK: normal movements of the head and neck  LUNGS: on inspection no signs of respiratory distress, breathing rate appears normal, no obvious gross SOB, gasping or wheezing  CV: no obvious cyanosis  MS: moves all visible extremities without noticeable abnormality  PSYCH/NEURO: pleasant and cooperative, for the majority of the visit, gets stressed when he talks about work  ASSESSMENT AND PLAN:  Discussed the following assessment and plan:  Stress  -we discussed possible serious and likely etiologies, options for evaluation and workup, limitations of telemedicine visit vs in person visit, treatment, treatment risks and precautions. Discussed various treatment options for stress and he is agreeable to seeing [facility] for help. Number provided and they agree to call today. They really want a visit with PCP and I will send a note to schedulers to assist - she has openings tomorrow. He wants further help with his RLS and with FMLA for work. Wrote work note for today until seen by PCP.  Advised if stress not improving with CBT he may need a medication as well and we discussed some options. He will consider and discuss PCP. Advised of emergency care options if any thoughts of harm to self or others or severe symptoms arise. Verbal contract for safety. Patient agrees to seek prompt in person care if worsening, new symptoms arise, or if is not improving with treatment.   I discussed the assessment and treatment plan with the patient. The patient was provided an opportunity to ask questions and all were answered. The patient agreed with the plan and demonstrated an understanding of the instructions.   The patient was advised to call back or seek an in-person evaluation if the symptoms worsen or if the condition fails to improve as anticipated.   Lucretia Kern, DO   Patient Instructions  Call the  [facility]

## 2018-12-30 NOTE — Patient Instructions (Addendum)
Call the [facility] number provided right away to schedule an appointment: [phone]  Seek emergency care if any severe symptoms, feelings of losing control, thought of harm to your self or others.  I sent a message to schedulers to assist with scheduling a visit with Dr. Jerilee Hoh. Call our office if you have not been contacted about this appointment by tomorrow morning.  I hope you are feeling better soon!       WORK SLIP:  Please excuse patient Patrick [name],  1965/12/02, seen in consultation today 12/30/2018,  from work until seen by his doctor tomorrow for further evaluation and recommendations.   Sincerely: E-signature: Dr. Colin Benton, DO [place] Ph: [phone]

## 2018-12-31 ENCOUNTER — Other Ambulatory Visit: Payer: Self-pay

## 2018-12-31 ENCOUNTER — Telehealth: Payer: Self-pay | Admitting: Internal Medicine

## 2018-12-31 ENCOUNTER — Telehealth (INDEPENDENT_AMBULATORY_CARE_PROVIDER_SITE_OTHER): Payer: BLUE CROSS/BLUE SHIELD | Admitting: Internal Medicine

## 2018-12-31 DIAGNOSIS — F322 Major depressive disorder, single episode, severe without psychotic features: Secondary | ICD-10-CM

## 2018-12-31 MED ORDER — DULOXETINE HCL 30 MG PO CPEP: 30.0000 mg | ORAL_CAPSULE | Freq: Every day | ORAL | 0 refills | Status: DC

## 2018-12-31 NOTE — Progress Notes (Signed)
Virtual Visit via Video Note  I connected with Patrick [name] on 12/31/18 at  1:30 PM EDT by a video enabled telemedicine application and verified that I am speaking with the correct person using two identifiers.  Location patient: home Location provider: work office Persons participating in the virtual visit: patient, provider  I discussed the limitations of evaluation and management by telemedicine and the availability of in person appointments. The patient expressed understanding and agreed to proceed.   HPI: He has scheduled this visit as a follow-up to a virtual visit with Patrick [name] yesterday.  He has been having significant stress at work and anxiety.  He feels depressed and "like he does not want to live".  He has no suicidal ideation although he feels like giving up.  He hurts all the time.  He gets angry frequently.  He has never been on depression medications before.  Accompanying him on this visit today is his friend Patrick [name].  She is helping him get an appointment with a counselor following visit with Patrick [name] yesterday.  They were told that first available appointment was in 2 weeks.  Patrick [name] wonders if there are any medications that we can try.  He also wants to know if there is anything that we can do for his pain as he was recently diagnosed with hyperkalemia and asked to refrain from use of NSAIDs.   ROS: Constitutional: Denies fever, chills, diaphoresis, appetite change and fatigue.  HEENT: Denies photophobia, eye pain, redness, hearing loss, ear pain, congestion, sore throat, rhinorrhea, sneezing, mouth sores, trouble swallowing, neck pain, neck stiffness and tinnitus.   Respiratory: Denies SOB, DOE, cough, chest tightness,  and wheezing.   Cardiovascular: Denies chest pain, palpitations and leg swelling.  Gastrointestinal: Denies nausea, vomiting, abdominal pain, diarrhea, constipation, blood in stool and abdominal distention.  Genitourinary: Denies dysuria, urgency,  frequency, hematuria, flank pain and difficulty urinating.  Endocrine: Denies: hot or cold intolerance, sweats, changes in hair or nails, polyuria, polydipsia. Musculoskeletal: Denies myalgias, back pain, joint swelling, arthralgias and gait problem.  Skin: Denies pallor, rash and wound.  Neurological: Denies dizziness, seizures, syncope, weakness, light-headedness, numbness and headaches.  Hematological: Denies adenopathy. Easy bruising, personal or family bleeding history  Psychiatric/Behavioral: Denies suicidal ideation, mood changes, confusion, nervousness, sleep disturbance and agitation   Past Medical History:  Diagnosis Date  . Arthritis   . Blood transfusion without reported diagnosis 1984   20+ units  . RLS (restless legs syndrome)   . Tobacco use     Past Surgical History:  Procedure Laterality Date  . arm surgery  1984   MVI  . CARDIAC SURGERY  1984   MVI  . CHOLECYSTECTOMY  ?2007  . KIDNEY SURGERY  1984   MVI  . MANDIBLE FRACTURE SURGERY  1984   MVI  . TOTAL HIP ARTHROPLASTY Left 09/07/2016   Procedure: TOTAL HIP ARTHROPLASTY;  Surgeon: Patrick Server, MD;  Location: [facility];  Service: Orthopedics;  Laterality: Left;    Family History  Problem Relation Age of Onset  . Leukemia Father 53       Due to agent orange, deceased  . Hearing loss Mother        Living  . Diabetes Brother   . Obesity Brother   . Healthy Brother   . Alcohol abuse Sister   . Colon cancer Neg Hx   . Esophageal cancer Neg Hx   . Stomach cancer Neg Hx   . Rectal cancer Neg  Hx   . Colon polyps Neg Hx     SOCIAL HX:   reports that he has been smoking cigarettes. He has a 26.25 pack-year smoking history. He has never used smokeless tobacco. He reports current alcohol use of about 2.0 standard drinks of alcohol per week. He reports that he does not use drugs.   Current Outpatient Medications:  .  acetaminophen (TYLENOL) 500 MG tablet, Take 1,500 mg by mouth every 6 (six) hours as needed  for mild pain., Disp: , Rfl:  .  ALPRAZolam (XANAX) 0.25 MG tablet, TAKE 1 TABLET BY MOUTH ONCE DAILY AS NEEDED FOR ANXIETY, Disp: 15 tablet, Rfl: 2 .  DULoxetine (CYMBALTA) 30 MG capsule, Take 1 capsule (30 mg total) by mouth daily., Disp: 90 capsule, Rfl: 0 .  Glucosamine-Chondroitin (OSTEO BI-FLEX REGULAR STRENGTH PO), Take by mouth., Disp: , Rfl:  .  ibuprofen (ADVIL) 200 MG tablet, Take 400 mg by mouth every 6 (six) hours as needed for mild pain., Disp: , Rfl:  .  magnesium oxide (MAG-OX) 400 MG tablet, Take 400 mg by mouth daily., Disp: , Rfl:  .  Misc Natural Products (FOCUSED MIND PO), Take by mouth., Disp: , Rfl:  .  Multiple Vitamin (ONE-A-DAY MENS PO), Take by mouth., Disp: , Rfl:  .  Na Sulfate-K Sulfate-Mg Sulf 17.5-3.13-1.6 GM/177ML SOLN, Suprep (no substitutions)-TAKE AS DIRECTED., Disp: 354 mL, Rfl: 0 .  Omega-3 Fatty Acids (FISH OIL PO), Take by mouth., Disp: , Rfl:  .  rOPINIRole (REQUIP) 1 MG tablet, Take half tab daily as needed, Disp: 90 tablet, Rfl: 0 .  rOPINIRole (REQUIP) 4 MG tablet, Take 1 tablet (4 mg total) by mouth at bedtime., Disp: 90 tablet, Rfl: 1 .  UNABLE TO FIND, Med Name: Restful leg OTC Hyland's, Disp: , Rfl:   EXAM:   VITALS per patient if applicable: None reported  GENERAL: alert, oriented, appears well and in no acute distress  HEENT: atraumatic, conjunttiva clear, no obvious abnormalities on inspection of external nose and ears, wears corrective lenses  NECK: normal movements of the head and neck  LUNGS: on inspection no signs of respiratory distress, breathing rate appears normal, no obvious gross increased work of breathing, gasping or wheezing  CV: no obvious cyanosis  MS: moves all visible extremities without noticeable abnormality  PSYCH/NEURO: pleasant and cooperative, no obvious changes in affect or mood, speech and thought processing grossly intact  ASSESSMENT AND PLAN:   Depression, major, single episode, severe (HCC)  -They have  already started the process of scheduling CBT sessions. -I believe given his significant issues that we should start antidepressants. -As he has significant pain issues I believe that Cymbalta is a good choice.  We will start 30 mg daily. -Will schedule follow-up in 6 weeks.  Time spent: 30 minutes. Greater than 50% of this time was spent in direct contact with the patient, coordinating care and discussing relevant ongoing clinical issues.   I discussed the assessment and treatment plan with the patient. The patient was provided an opportunity to ask questions and all were answered. The patient agreed with the plan and demonstrated an understanding of the instructions.   The patient was advised to call back or seek an in-person evaluation if the symptoms worsen or if the condition fails to improve as anticipated.    Lelon Frohlich, MD  [facility] at [facility]

## 2018-12-31 NOTE — Telephone Encounter (Signed)
Refill already sent.

## 2018-12-31 NOTE — Telephone Encounter (Unsigned)
Copied from [place] [phone]. Topic: General - Other >> Dec 29, 2018  2:12 PM Wynetta Emery, [place] C wrote: Reason for CRM: pt called in and would like to know if provider or her assistant would him a call back to discuss medications? Pt declined giving details of the call    CB: [phone]

## 2018-12-31 NOTE — Telephone Encounter (Signed)
Pt just saw DR H and was going to write a script for cymbalta please send to  [facility] [id number] [place]  [place] [address]  Phone: [phone] Fax: [phone]

## 2019-01-01 ENCOUNTER — Telehealth: Payer: Self-pay

## 2019-01-01 ENCOUNTER — Encounter: Payer: Self-pay | Admitting: Internal Medicine

## 2019-01-01 ENCOUNTER — Other Ambulatory Visit: Payer: BLUE CROSS/BLUE SHIELD

## 2019-01-01 ENCOUNTER — Other Ambulatory Visit: Payer: Self-pay

## 2019-01-01 NOTE — Telephone Encounter (Signed)
Covid-19 screening questions   Do you now or have you had a fever in the last 14 days?  Do you have any respiratory symptoms of shortness of breath or cough now or in the last 14 days?  Do you have any family members or close contacts with diagnosed or suspected Covid-19 in the past 14 days?  Have you been tested for Covid-19 and found to be positive?       

## 2019-01-02 ENCOUNTER — Ambulatory Visit (AMBULATORY_SURGERY_CENTER): Payer: BLUE CROSS/BLUE SHIELD | Admitting: Gastroenterology

## 2019-01-02 ENCOUNTER — Other Ambulatory Visit: Payer: Self-pay

## 2019-01-02 ENCOUNTER — Encounter: Payer: Self-pay | Admitting: Gastroenterology

## 2019-01-02 VITALS — BP 136/91 | HR 76 | Temp 98.6°F | Resp 14 | Ht 68.0 in | Wt 263.0 lb

## 2019-01-02 DIAGNOSIS — D124 Benign neoplasm of descending colon: Secondary | ICD-10-CM

## 2019-01-02 DIAGNOSIS — Z1211 Encounter for screening for malignant neoplasm of colon: Secondary | ICD-10-CM

## 2019-01-02 MED ORDER — SODIUM CHLORIDE 0.9 % IV SOLN: 500.0000 mL | Freq: Once | INTRAVENOUS | Status: DC

## 2019-01-02 NOTE — Op Note (Signed)
[facility] Patient Name: Patrick [name] Procedure Date: 01/02/2019 11:27 AM MRN: [id number] Endoscopist: Thornton Park MD, MD Age: 53 Referring MD:  Date of Birth: May 27, 1965 Gender: Male Account #: [id number] Procedure:                Colonoscopy Indications:              Screening for colorectal malignant neoplasm                           No known family history of colon cancer or polyps                           No baseline GI symptoms                           Patient reports a colonoscopy prior to                            cholecystectomy. I am unable to locate those                            results in EPIC. Medicines:                See the Anesthesia note for documentation of the                            administered medications Procedure:                Pre-Anesthesia Assessment:                           - Prior to the procedure, a History and Physical                            was performed, and patient medications and                            allergies were reviewed. The patient's tolerance of                            previous anesthesia was also reviewed. The risks                            and benefits of the procedure and the sedation                            options and risks were discussed with the patient.                            All questions were answered, and informed consent                            was obtained. Prior Anticoagulants: The patient has  taken no previous anticoagulant or antiplatelet                            agents. ASA Grade Assessment: II - A patient with                            mild systemic disease. After reviewing the risks                            and benefits, the patient was deemed in                            satisfactory condition to undergo the procedure.                           After obtaining informed consent, the colonoscope                            was passed under  direct vision. Throughout the                            procedure, the patient's blood pressure, pulse, and                            oxygen saturations were monitored continuously. The                            LOANER 0255 was introduced through the anus and                            advanced to the the terminal ileum, with                            identification of the appendiceal orifice and IC                            valve. A second forward view of the right colon was                            performed. The colonoscopy was performed with                            moderate difficulty due to a redundant colon,                            significant looping and a tortuous colon.                            Successful completion of the procedure was aided by                            applying abdominal pressure. The patient tolerated  the procedure well. The quality of the bowel                            preparation was good. The terminal ileum, ileocecal                            valve, appendiceal orifice, and rectum were                            photographed. Scope In: 11:34:55 AM Scope Out: 11:53:42 AM Scope Withdrawal Time: 0 hours 14 minutes 11 seconds  Total Procedure Duration: 0 hours 18 minutes 47 seconds  Findings:                 The perianal and digital rectal examinations were                            normal.                           Two sessile polyps were found in the descending                            colon. The polyps were 2 to 3 mm in size. These                            polyps were removed with a cold snare. Resection                            and retrieval were complete. Estimated blood loss                            was minimal.                           Multiple small and large-mouthed diverticula were                            found in the sigmoid colon. There was no evidence                            for  diverticulitis.                           Non-bleeding external and internal hemorrhoids were                            found.                           The exam was otherwise without abnormality on                            direct and retroflexion views. Complications:            No immediate complications. Estimated blood loss:  Minimal. Estimated Blood Loss:     Estimated blood loss was minimal. Impression:               - Two 2 to 3 mm polyps in the descending colon,                            removed with a cold snare. Resected and retrieved.                           - Diverticulosis in the sigmoid colon.                           - Non-bleeding external and internal hemorrhoids.                           - The examination was otherwise normal on direct                            and retroflexion views. Recommendation:           - Patient has a contact number available for                            emergencies. The signs and symptoms of potential                            delayed complications were discussed with the                            patient. Return to normal activities tomorrow.                            Written discharge instructions were provided to the                            patient.                           - High fiber diet today.                           - Continue present medications.                           - Await pathology results.                           - Repeat colonoscopy date to be determined after                            pending pathology results are reviewed for                            surveillance based on pathology results. Thornton Park MD, MD 01/02/2019 12:00:22 PM This report has been signed electronically.

## 2019-01-02 NOTE — Patient Instructions (Signed)
YOU HAD AN ENDOSCOPIC PROCEDURE TODAY AT [facility]:   Refer to the procedure report that was given to you for any specific questions about what was found during the examination.  If the procedure report does not answer your questions, please call your gastroenterologist to clarify.  If you requested that your care partner not be given the details of your procedure findings, then the procedure report has been included in a sealed envelope for you to review at your convenience later.  YOU SHOULD EXPECT: Some feelings of bloating in the abdomen. Passage of more gas than usual.  Walking can help get rid of the air that was put into your GI tract during the procedure and reduce the bloating. If you had a lower endoscopy (such as a colonoscopy or flexible sigmoidoscopy) you may notice spotting of blood in your stool or on the toilet paper. If you underwent a bowel prep for your procedure, you may not have a normal bowel movement for a few days.  Please Note:  You might notice some irritation and congestion in your nose or some drainage.  This is from the oxygen used during your procedure.  There is no need for concern and it should clear up in a day or so.  SYMPTOMS TO REPORT IMMEDIATELY:   Following lower endoscopy (colonoscopy or flexible sigmoidoscopy):  Excessive amounts of blood in the stool  Significant tenderness or worsening of abdominal pains  Swelling of the abdomen that is new, acute  Fever of 100F or higher  For urgent or emergent issues, a gastroenterologist can be reached at any hour by calling [phone].  DIET:  We do recommend a small meal at first, but then you may proceed to your regular diet.  Drink plenty of fluids but you should avoid alcoholic beverages for 24 hours.  ACTIVITY:  You should plan to take it easy for the rest of today and you should NOT DRIVE or use heavy machinery until tomorrow (because of the sedation medicines used during the test).     FOLLOW UP: Our staff will call the number listed on your records 48-72 hours following your procedure to check on you and address any questions or concerns that you may have regarding the information given to you following your procedure. If we do not reach you, we will leave a message.  We will attempt to reach you two times.  During this call, we will ask if you have developed any symptoms of COVID 19. If you develop any symptoms (ie: fever, flu-like symptoms, shortness of breath, cough etc.) before then, please call [phone].  If you test positive for Covid 19 in the 2 weeks post procedure, please call and report this information to [place].    If any biopsies were taken you will be contacted by phone or by letter within the next 1-3 weeks.  Please call us at [phone] if you have not heard about the biopsies in 3 weeks.   SIGNATURES/CONFIDENTIALITY: You and/or your care partner have signed paperwork which will be entered into your electronic medical record.  These signatures attest to the fact that that the information above on your After Visit Summary has been reviewed and is understood.  Full responsibility of the confidentiality of this discharge information lies with you and/or your care-partner.  Await pathology  Please read over handouts about polyps, diverticulosis, hemorrhoids, and high fiber diets  Please continue your normal medications

## 2019-01-02 NOTE — Progress Notes (Signed)
Temperature- Karina Athens  VS- Courtney Washington  Pt's states no medical or surgical changes since previsit or office visit.  

## 2019-01-02 NOTE — Progress Notes (Signed)
Report given to PACU, vss 

## 2019-01-02 NOTE — Progress Notes (Signed)
Called to room to assist during endoscopic procedure.  Patient ID and intended procedure confirmed with present staff. Received instructions for my participation in the procedure from the performing physician.  

## 2019-01-05 ENCOUNTER — Other Ambulatory Visit: Payer: Self-pay

## 2019-01-06 ENCOUNTER — Telehealth: Payer: Self-pay

## 2019-01-06 NOTE — Telephone Encounter (Signed)
  Follow up Call-  Call back number 01/02/2019  Post procedure Call Back phone  # [phone]  Permission to leave phone message Yes  Some recent data might be hidden     Patient questions:  Do you have a fever, pain , or abdominal swelling? No. Pain Score  0 *  Have you tolerated food without any problems? Yes.    Have you been able to return to your normal activities? Yes.    Do you have any questions about your discharge instructions: Diet   No. Medications  No. Follow up visit  No.  Do you have questions or concerns about your Care? No.  Actions: * If pain score is 4 or above: 1. No action needed, pain <4.Have you developed a fever since your procedure? no  2.   Have you had an respiratory symptoms (SOB or cough) since your procedure? no  3.   Have you tested positive for COVID 19 since your procedure no  4.   Have you had any family members/close contacts diagnosed with the COVID 19 since your procedure?  no   If yes to any of these questions please route to Joylene John, RN and Alphonsa Gin, Therapist, sports.

## 2019-01-06 NOTE — Telephone Encounter (Signed)
No answer, left message to call back later today, B.Jerzey Komperda RN. 

## 2019-01-07 ENCOUNTER — Encounter: Payer: Self-pay | Admitting: Gastroenterology

## 2019-01-08 ENCOUNTER — Telehealth: Payer: BLUE CROSS/BLUE SHIELD | Admitting: Internal Medicine

## 2019-01-12 ENCOUNTER — Ambulatory Visit (INDEPENDENT_AMBULATORY_CARE_PROVIDER_SITE_OTHER): Payer: BLUE CROSS/BLUE SHIELD | Admitting: Psychology

## 2019-01-12 DIAGNOSIS — F4323 Adjustment disorder with mixed anxiety and depressed mood: Secondary | ICD-10-CM

## 2019-01-22 ENCOUNTER — Ambulatory Visit (INDEPENDENT_AMBULATORY_CARE_PROVIDER_SITE_OTHER): Payer: BLUE CROSS/BLUE SHIELD | Admitting: Psychology

## 2019-01-22 DIAGNOSIS — F4323 Adjustment disorder with mixed anxiety and depressed mood: Secondary | ICD-10-CM

## 2019-01-23 ENCOUNTER — Telehealth: Payer: Self-pay | Admitting: Internal Medicine

## 2019-01-23 ENCOUNTER — Telehealth: Payer: Self-pay | Admitting: *Deleted

## 2019-01-23 NOTE — Telephone Encounter (Signed)
Copied from [place] [phone]. Topic: General - Inquiry >> Jan 23, 2019  9:37 AM Mathis Bud wrote: Reason for CRM: patient is requesting a call back from nurse.  Patient needs PCP to feel out paper work due to [employer].Patient wants to drop off paperwork. Call back [phone]

## 2019-01-23 NOTE — Telephone Encounter (Signed)
Signed        The patient dropped of a Physical/Mental Accommodations Assessment  Fax to: [phone]  Patient would like to be contacted through [place] just to let him know when the paperwork has been completed and faxed.  Disposition: Dr's Folder

## 2019-01-26 ENCOUNTER — Telehealth: Payer: Self-pay | Admitting: *Deleted

## 2019-01-26 NOTE — Telephone Encounter (Signed)
Copied from [place] [id number]. Topic: General - Inquiry >> Jan 26, 2019  2:10 PM Alease Frame wrote: Reason for CRM: Patient called in asking to speak with Dr Jerilee Hoh  nurse regarding paper work that was sent in Friday [phone]. Patient would like a call back   [phone]

## 2019-01-27 NOTE — Telephone Encounter (Signed)
Placed in Dr Hernandez's folder 

## 2019-01-27 NOTE — Telephone Encounter (Signed)
Pt is calling checking on the status of the form and would like callback

## 2019-01-30 ENCOUNTER — Encounter: Payer: Self-pay | Admitting: Internal Medicine

## 2019-01-30 NOTE — Telephone Encounter (Signed)
Left message on machine for patient to return our call.  Per Dr Jerilee Hoh.  She is only able to fill out the form with mental health information and not physical impairment as she has not treated him for that.   If he is okay with Dr Jerilee Hoh filling out information about his mental health, she will complete the form.

## 2019-02-02 ENCOUNTER — Encounter: Payer: Self-pay | Admitting: Internal Medicine

## 2019-02-02 NOTE — Telephone Encounter (Signed)
Patient called in for status of previous messages . Please advise

## 2019-02-02 NOTE — Telephone Encounter (Signed)
Pt has been scheduled for Virtual appt. At 330 pm tomorrow only spot available.   Rachael please call pt to cancel appt. If pt is not available or if this is not appropriate or needed for Dr. Jerilee Hoh.

## 2019-02-02 NOTE — Telephone Encounter (Signed)
Patient is calling to schedule a virtual appt to get forms completed for his job. The patient states that he is up for termination tomorrow if the forms are not completed. Patient states that he will call back before the close of the business day to see if Dr. Rayetta Pigg can perhaps get him in tomorrow morning. So that he is not terminated from his employment. Please advise CB- [phone]

## 2019-02-03 ENCOUNTER — Other Ambulatory Visit: Payer: Self-pay

## 2019-02-03 ENCOUNTER — Telehealth: Payer: BLUE CROSS/BLUE SHIELD | Admitting: Internal Medicine

## 2019-02-03 NOTE — Telephone Encounter (Signed)
Patient has scheduled a virtual visit 02/03/2019.

## 2019-02-03 NOTE — Telephone Encounter (Signed)
Noted.  Patient should keep his appointment.

## 2019-02-04 ENCOUNTER — Other Ambulatory Visit: Payer: Self-pay

## 2019-02-04 ENCOUNTER — Telehealth (INDEPENDENT_AMBULATORY_CARE_PROVIDER_SITE_OTHER): Payer: BLUE CROSS/BLUE SHIELD | Admitting: Internal Medicine

## 2019-02-04 DIAGNOSIS — F3132 Bipolar disorder, current episode depressed, moderate: Secondary | ICD-10-CM

## 2019-02-04 NOTE — Progress Notes (Signed)
Virtual Visit via Video Note  I connected with Patrick [name] on 02/04/19 at 10:30 AM EDT by a video enabled telemedicine application and verified that I am speaking with the correct person using two identifiers.  Location patient: home Location provider: work office Persons participating in the virtual visit: patient, provider  I discussed the limitations of evaluation and management by telemedicine and the availability of in person appointments. The patient expressed understanding and agreed to proceed.   HPI: He has scheduled this visit for follow-up on depression and to fill out forms for work.  Since I last saw him in early September he was started on Cymbalta 30 mg daily.  He has also been attending counseling sessions, he states he has been doing great he feels well but he is still very upset with his manager at work.  His work is requesting a physical accommodation form due to some deficiencies that they have observed in him.  When I have told him the issues that they have with him such as bending and standing for prolonged periods of time, he states this is not the case and he does this routinely every day at work.   ROS: Constitutional: Denies fever, chills, diaphoresis, appetite change and fatigue.  HEENT: Denies photophobia, eye pain, redness, hearing loss, ear pain, congestion, sore throat, rhinorrhea, sneezing, mouth sores, trouble swallowing, neck pain, neck stiffness and tinnitus.   Respiratory: Denies SOB, DOE, cough, chest tightness,  and wheezing.   Cardiovascular: Denies chest pain, palpitations and leg swelling.  Gastrointestinal: Denies nausea, vomiting, abdominal pain, diarrhea, constipation, blood in stool and abdominal distention.  Genitourinary: Denies dysuria, urgency, frequency, hematuria, flank pain and difficulty urinating.  Endocrine: Denies: hot or cold intolerance, sweats, changes in hair or nails, polyuria, polydipsia. Musculoskeletal: Denies myalgias,  back pain, joint swelling, arthralgias and gait problem.  Skin: Denies pallor, rash and wound.  Neurological: Denies dizziness, seizures, syncope, weakness, light-headedness, numbness and headaches.  Hematological: Denies adenopathy. Easy bruising, personal or family bleeding history  Psychiatric/Behavioral: Denies suicidal ideation, mood changes, confusion, nervousness, sleep disturbance and agitation   Past Medical History:  Diagnosis Date  . Arthritis   . Blood transfusion without reported diagnosis 1984   20+ units  . RLS (restless legs syndrome)   . Tobacco use     Past Surgical History:  Procedure Laterality Date  . arm surgery  1984   MVI  . CARDIAC SURGERY  1984   MVI  . CHOLECYSTECTOMY  ?2007  . KIDNEY SURGERY  1984   MVI  . MANDIBLE FRACTURE SURGERY  1984   MVI  . 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 Leukemia Father 65       Due to agent orange, deceased  . Hearing loss Mother        Living  . Diabetes Brother   . Obesity Brother   . Healthy Brother   . Alcohol abuse Sister   . Colon cancer Neg Hx   . Esophageal cancer Neg Hx   . Stomach cancer Neg Hx   . Rectal cancer Neg Hx   . Colon polyps Neg Hx     SOCIAL HX:   reports that he has been smoking cigarettes. He has a 26.25 pack-year smoking history. He has never used smokeless tobacco. He  reports current alcohol use of about 2.0 standard drinks of alcohol per week. He reports that he does not use drugs.   Current Outpatient Medications:  .  acetaminophen (TYLENOL) 500 MG tablet, Take 1,500 mg by mouth every 6 (six) hours as needed for mild pain., Disp: , Rfl:  .  ALPRAZolam (XANAX) 0.25 MG tablet, TAKE 1 TABLET BY MOUTH ONCE DAILY AS NEEDED FOR ANXIETY, Disp: 15 tablet, Rfl: 2 .  DULoxetine (CYMBALTA) 30 MG capsule, Take 1  capsule (30 mg total) by mouth daily., Disp: 90 capsule, Rfl: 0 .  Glucosamine-Chondroitin (OSTEO BI-FLEX REGULAR STRENGTH PO), Take by mouth., Disp: , Rfl:  .  ibuprofen (ADVIL) 200 MG tablet, Take 400 mg by mouth every 6 (six) hours as needed for mild pain., Disp: , Rfl:  .  magnesium oxide (MAG-OX) 400 MG tablet, Take 400 mg by mouth daily., Disp: , Rfl:  .  Misc Natural Products (FOCUSED MIND PO), Take by mouth., Disp: , Rfl:  .  Multiple Vitamin (ONE-A-DAY MENS PO), Take by mouth., Disp: , Rfl:  .  Omega-3 Fatty Acids (FISH OIL PO), Take by mouth., Disp: , Rfl:  .  rOPINIRole (REQUIP) 1 MG tablet, Take half tab daily as needed, Disp: 90 tablet, Rfl: 0 .  rOPINIRole (REQUIP) 4 MG tablet, Take 1 tablet (4 mg total) by mouth at bedtime., Disp: 90 tablet, Rfl: 1 .  UNABLE TO FIND, Med Name: Restful leg OTC [employer]'s, Disp: , Rfl:   EXAM:   VITALS per patient if applicable: None reported  GENERAL: alert, oriented, appears well and in no acute distress  HEENT: atraumatic, conjunttiva clear, no obvious abnormalities on inspection of external nose and ears, wears corrective lenses  NECK: normal movements of the head and neck  LUNGS: on inspection no signs of respiratory distress, breathing rate appears normal, no obvious gross increased work of breathing, gasping or wheezing  CV: no obvious cyanosis  MS: moves all visible extremities without noticeable abnormality  PSYCH/NEURO: pleasant and cooperative, no obvious depression or anxiety, speech and thought processing grossly intact  ASSESSMENT AND PLAN:   Bipolar affective disorder, currently depressed, moderate ([place]) -He feels much improved since starting Cymbalta and CBT sessions. -Work form has been filled out and faxed back.    I discussed the assessment and treatment plan with the patient. The patient was provided an opportunity to ask questions and all were answered. The patient agreed with the plan and demonstrated an  understanding of the instructions.   The patient was advised to call back or seek an in-person evaluation if the symptoms worsen or if the condition fails to improve as anticipated.    Lelon Frohlich, MD  [facility]

## 2019-02-10 ENCOUNTER — Encounter: Payer: Self-pay | Admitting: Internal Medicine

## 2019-02-11 ENCOUNTER — Other Ambulatory Visit: Payer: Self-pay

## 2019-02-11 ENCOUNTER — Encounter: Payer: Self-pay | Admitting: Internal Medicine

## 2019-02-11 ENCOUNTER — Telehealth (INDEPENDENT_AMBULATORY_CARE_PROVIDER_SITE_OTHER): Payer: BLUE CROSS/BLUE SHIELD | Admitting: Internal Medicine

## 2019-02-11 DIAGNOSIS — F3132 Bipolar disorder, current episode depressed, moderate: Secondary | ICD-10-CM

## 2019-02-11 DIAGNOSIS — Z20828 Contact with and (suspected) exposure to other viral communicable diseases: Secondary | ICD-10-CM

## 2019-02-11 DIAGNOSIS — Z20822 Contact with and (suspected) exposure to covid-19: Secondary | ICD-10-CM

## 2019-02-11 MED ORDER — BUPROPION HCL ER (XL) 150 MG PO TB24: 150.0000 mg | ORAL_TABLET | Freq: Every day | ORAL | 1 refills | Status: AC

## 2019-02-11 NOTE — Progress Notes (Signed)
Virtual Visit via Video Note  I connected with Patrick [name] on 02/11/19 at  3:30 PM EST by a video enabled telemedicine application and verified that I am speaking with the correct person using two identifiers.  Location patient: home Location provider: work office Persons participating in the virtual visit: patient, provider  I discussed the limitations of evaluation and management by telemedicine and the availability of in person appointments. The patient expressed understanding and agreed to proceed.   HPI: 2 days ago had a temp of 102, has been around 99.8 since. Has been feeling achy, with significant sore throat and post-nasal drip. Has also had significant dry cough and very mild SOB.  Also states he feels like cymbalta hasn't made much difference in his moods. Feels like CBT sessions have been very helpful. Would like to know what other medication options are available to him.   ROS: Constitutional: Denies fever, chills, diaphoresis, appetite change and fatigue.  HEENT: Denies photophobia, eye pain, redness, hearing loss, ear pain, congestion, sore throat, rhinorrhea, sneezing, mouth sores, trouble swallowing, neck pain, neck stiffness and tinnitus.   Respiratory: Denies SOB, DOE, cough, chest tightness,  and wheezing.   Cardiovascular: Denies chest pain, palpitations and leg swelling.  Gastrointestinal: Denies nausea, vomiting, abdominal pain, diarrhea, constipation, blood in stool and abdominal distention.  Genitourinary: Denies dysuria, urgency, frequency, hematuria, flank pain and difficulty urinating.  Endocrine: Denies: hot or cold intolerance, sweats, changes in hair or nails, polyuria, polydipsia. Musculoskeletal: Denies myalgias, back pain, joint swelling, arthralgias and gait problem.  Skin: Denies pallor, rash and wound.  Neurological: Denies dizziness, seizures, syncope, weakness, light-headedness, numbness and headaches.  Hematological: Denies adenopathy. Easy  bruising, personal or family bleeding history  Psychiatric/Behavioral: Denies suicidal ideation, mood changes, confusion, nervousness, sleep disturbance and agitation   Past Medical History:  Diagnosis Date  . Arthritis   . Blood transfusion without reported diagnosis 1984   20+ units  . RLS (restless legs syndrome)   . Tobacco use     Past Surgical History:  Procedure Laterality Date  . arm surgery  1984   MVI  . CARDIAC SURGERY  1984   MVI  . CHOLECYSTECTOMY  ?2007  . KIDNEY SURGERY  1984   MVI  . MANDIBLE FRACTURE SURGERY  1984   MVI  . TOTAL HIP ARTHROPLASTY Left 09/07/2016   Procedure: TOTAL HIP ARTHROPLASTY;  Surgeon: Earlie Server, MD;  Location: [facility];  Service: Orthopedics;  Laterality: Left;    Family History  Problem Relation Age of Onset  . Leukemia Father 57       Due to agent orange, deceased  . Hearing loss Mother        Living  . Diabetes Brother   . Obesity Brother   . Healthy Brother   . Alcohol abuse Sister   . Colon cancer Neg Hx   . Esophageal cancer Neg Hx   . Stomach cancer Neg Hx   . Rectal cancer Neg Hx   . Colon polyps Neg Hx     SOCIAL HX:   reports that he has been smoking cigarettes. He has a 26.25 pack-year smoking history. He has never used smokeless tobacco. He reports current alcohol use of about 2.0 standard drinks of alcohol per week. He reports that he does not use drugs.   Current Outpatient Medications:  .  acetaminophen (TYLENOL) 500 MG tablet, Take 1,500 mg by mouth every 6 (six) hours as needed for mild pain., Disp: , Rfl:  .  ALPRAZolam (XANAX) 0.25 MG tablet, TAKE 1 TABLET BY MOUTH ONCE DAILY AS NEEDED FOR ANXIETY, Disp: 15 tablet, Rfl: 2 .  buPROPion (WELLBUTRIN XL) 150 MG 24 hr tablet, Take 1 tablet (150 mg total) by mouth daily., Disp: 90 tablet, Rfl: 1 .  DULoxetine (CYMBALTA) 30 MG capsule, Take 1 capsule (30 mg total) by mouth daily., Disp: 90 capsule, Rfl: 0 .  Glucosamine-Chondroitin (OSTEO BI-FLEX REGULAR  STRENGTH PO), Take by mouth., Disp: , Rfl:  .  ibuprofen (ADVIL) 200 MG tablet, Take 400 mg by mouth every 6 (six) hours as needed for mild pain., Disp: , Rfl:  .  magnesium oxide (MAG-OX) 400 MG tablet, Take 400 mg by mouth daily., Disp: , Rfl:  .  Misc Natural Products (FOCUSED MIND PO), Take by mouth., Disp: , Rfl:  .  Multiple Vitamin (ONE-A-DAY MENS PO), Take by mouth., Disp: , Rfl:  .  Omega-3 Fatty Acids (FISH OIL PO), Take by mouth., Disp: , Rfl:  .  rOPINIRole (REQUIP) 1 MG tablet, Take half tab daily as needed, Disp: 90 tablet, Rfl: 0 .  rOPINIRole (REQUIP) 4 MG tablet, Take 1 tablet (4 mg total) by mouth at bedtime., Disp: 90 tablet, Rfl: 1 .  UNABLE TO FIND, Med Name: Restful leg OTC Hyland's, Disp: , Rfl:   EXAM:   VITALS per patient if applicable: none reported  GENERAL: alert, oriented, appears well and in no acute distress  HEENT: atraumatic, conjunttiva clear, no obvious abnormalities on inspection of external nose and ears, wears corrective lenses.  NECK: normal movements of the head and neck  LUNGS: on inspection no signs of respiratory distress, breathing rate appears normal, no obvious gross increased work of breathing, gasping or wheezing  CV: no obvious cyanosis  MS: moves all visible extremities without noticeable abnormality  PSYCH/NEURO: pleasant and cooperative, no obvious depression or anxiety, speech and thought processing grossly intact  ASSESSMENT AND PLAN:   Encounter by telehealth for suspected COVID-19 -He will go to the testing site tomorrow am. -Have advised quarantine, if positive, should be 10 days or 3 days after all symptoms have resolved, whichever period is longer. -Symptomatic management: antihistamines, pain relievers, expectorants and decongestants. -Advised to proceed to UC/ER for in-person evaluation is severe symptoms such as SOB or high fevers. -Needs to stay off work for now, will send work [employer].   Bipolar affective disorder,  currently depressed, moderate ([place]) -states he feels cymbalta has not made much difference. -Continues to attend CBT sessions with Enid Cutter. -Would like to try adding wellbutrin 150 mg. -Follow up in 6-8 weeks.    I discussed the assessment and treatment plan with the patient. The patient was provided an opportunity to ask questions and all were answered. The patient agreed with the plan and demonstrated an understanding of the instructions.   The patient was advised to call back or seek an in-person evaluation if the symptoms worsen or if the condition fails to improve as anticipated.    Lelon Frohlich, MD  [facility]

## 2019-02-12 ENCOUNTER — Ambulatory Visit: Payer: BLUE CROSS/BLUE SHIELD | Admitting: Psychology

## 2019-02-12 ENCOUNTER — Other Ambulatory Visit: Payer: Self-pay

## 2019-02-12 ENCOUNTER — Telehealth: Payer: Self-pay | Admitting: *Deleted

## 2019-02-12 DIAGNOSIS — Z20822 Contact with and (suspected) exposure to covid-19: Secondary | ICD-10-CM

## 2019-02-12 NOTE — Telephone Encounter (Signed)
Copied from [place] [id number]. Topic: Quick Communication - See Telephone Encounter >> Feb 11, 2019  4:13 PM Loma [place] wrote: CRM for notification. See Telephone encounter for: 02/11/19. 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

## 2019-02-12 NOTE — Telephone Encounter (Signed)
Left message on machine for patient to return our call to inform him that his letter was sent to his Mychart and a copy was faxed and confirmed to the name and number given.  CRM

## 2019-02-14 ENCOUNTER — Encounter: Payer: Self-pay | Admitting: Internal Medicine

## 2019-02-14 LAB — NOVEL CORONAVIRUS, NAA: SARS-CoV-2, NAA: NOT DETECTED

## 2019-02-15 ENCOUNTER — Encounter (HOSPITAL_COMMUNITY): Payer: Self-pay | Admitting: *Deleted

## 2019-02-15 ENCOUNTER — Emergency Department (HOSPITAL_COMMUNITY): Payer: BLUE CROSS/BLUE SHIELD

## 2019-02-15 ENCOUNTER — Inpatient Hospital Stay (HOSPITAL_COMMUNITY)
Admission: EM | Admit: 2019-02-15 | Discharge: 2019-02-17 | DRG: 314 | Disposition: A | Discharge status: Home / Self Care | Payer: BLUE CROSS/BLUE SHIELD | Attending: Internal Medicine | Admitting: Internal Medicine

## 2019-02-15 ENCOUNTER — Other Ambulatory Visit: Payer: Self-pay

## 2019-02-15 DIAGNOSIS — R079 Chest pain, unspecified: Secondary | ICD-10-CM | POA: Diagnosis not present

## 2019-02-15 DIAGNOSIS — G2581 Restless legs syndrome: Secondary | ICD-10-CM | POA: Diagnosis present

## 2019-02-15 DIAGNOSIS — F17210 Nicotine dependence, cigarettes, uncomplicated: Secondary | ICD-10-CM | POA: Diagnosis present

## 2019-02-15 DIAGNOSIS — R0602 Shortness of breath: Secondary | ICD-10-CM

## 2019-02-15 DIAGNOSIS — R0902 Hypoxemia: Secondary | ICD-10-CM | POA: Diagnosis not present

## 2019-02-15 DIAGNOSIS — K2100 Gastro-esophageal reflux disease with esophagitis, without bleeding: Secondary | ICD-10-CM | POA: Diagnosis not present

## 2019-02-15 DIAGNOSIS — Z20828 Contact with and (suspected) exposure to other viral communicable diseases: Secondary | ICD-10-CM | POA: Diagnosis present

## 2019-02-15 DIAGNOSIS — I119 Hypertensive heart disease without heart failure: Secondary | ICD-10-CM | POA: Diagnosis present

## 2019-02-15 DIAGNOSIS — F17200 Nicotine dependence, unspecified, uncomplicated: Secondary | ICD-10-CM | POA: Diagnosis present

## 2019-02-15 DIAGNOSIS — F419 Anxiety disorder, unspecified: Secondary | ICD-10-CM | POA: Diagnosis present

## 2019-02-15 DIAGNOSIS — I309 Acute pericarditis, unspecified: Principal | ICD-10-CM | POA: Diagnosis present

## 2019-02-15 DIAGNOSIS — F319 Bipolar disorder, unspecified: Secondary | ICD-10-CM | POA: Diagnosis present

## 2019-02-15 DIAGNOSIS — Z96642 Presence of left artificial hip joint: Secondary | ICD-10-CM | POA: Diagnosis present

## 2019-02-15 DIAGNOSIS — J9601 Acute respiratory failure with hypoxia: Secondary | ICD-10-CM | POA: Diagnosis present

## 2019-02-15 DIAGNOSIS — I10 Essential (primary) hypertension: Secondary | ICD-10-CM | POA: Diagnosis not present

## 2019-02-15 DIAGNOSIS — Z9049 Acquired absence of other specified parts of digestive tract: Secondary | ICD-10-CM

## 2019-02-15 DIAGNOSIS — M1990 Unspecified osteoarthritis, unspecified site: Secondary | ICD-10-CM | POA: Diagnosis present

## 2019-02-15 DIAGNOSIS — I712 Thoracic aortic aneurysm, without rupture: Secondary | ICD-10-CM | POA: Diagnosis present

## 2019-02-15 DIAGNOSIS — K219 Gastro-esophageal reflux disease without esophagitis: Secondary | ICD-10-CM | POA: Diagnosis present

## 2019-02-15 DIAGNOSIS — J441 Chronic obstructive pulmonary disease with (acute) exacerbation: Secondary | ICD-10-CM | POA: Diagnosis present

## 2019-02-15 DIAGNOSIS — Z20822 Contact with and (suspected) exposure to covid-19: Secondary | ICD-10-CM

## 2019-02-15 DIAGNOSIS — J4541 Moderate persistent asthma with (acute) exacerbation: Secondary | ICD-10-CM

## 2019-02-15 HISTORY — DX: Disorder of kidney and ureter, unspecified: N28.9

## 2019-02-15 LAB — CBC WITH DIFFERENTIAL/PLATELET
Abs Immature Granulocytes: 0.06 10*3/uL (ref 0.00–0.07)
Basophils Absolute: 0 10*3/uL (ref 0.0–0.1)
Basophils Relative: 0 %
Eosinophils Absolute: 0.2 10*3/uL (ref 0.0–0.5)
Eosinophils Relative: 2 %
HCT: 41.2 % (ref 39.0–52.0)
Hemoglobin: 13.1 g/dL (ref 13.0–17.0)
Immature Granulocytes: 1 %
Lymphocytes Relative: 21 %
Lymphs Abs: 2.3 10*3/uL (ref 0.7–4.0)
MCH: 29.4 pg (ref 26.0–34.0)
MCHC: 31.8 g/dL (ref 30.0–36.0)
MCV: 92.6 fL (ref 80.0–100.0)
Monocytes Absolute: 1 10*3/uL (ref 0.1–1.0)
Monocytes Relative: 9 %
Neutro Abs: 7.6 10*3/uL (ref 1.7–7.7)
Neutrophils Relative %: 67 %
Platelets: 227 10*3/uL (ref 150–400)
RBC: 4.45 MIL/uL (ref 4.22–5.81)
RDW: 13.5 % (ref 11.5–15.5)
WBC: 11.2 10*3/uL — ABNORMAL HIGH (ref 4.0–10.5)
nRBC: 0 % (ref 0.0–0.2)

## 2019-02-15 LAB — COMPREHENSIVE METABOLIC PANEL
ALT: 15 U/L (ref 0–44)
AST: 14 U/L — ABNORMAL LOW (ref 15–41)
Albumin: 3.4 g/dL — ABNORMAL LOW (ref 3.5–5.0)
Alkaline Phosphatase: 56 U/L (ref 38–126)
Anion gap: 10 (ref 5–15)
BUN: 14 mg/dL (ref 6–20)
CO2: 25 mmol/L (ref 22–32)
Calcium: 8.4 mg/dL — ABNORMAL LOW (ref 8.9–10.3)
Chloride: 100 mmol/L (ref 98–111)
Creatinine, Ser: 0.71 mg/dL (ref 0.61–1.24)
GFR calc Af Amer: >60 mL/min (ref >60)
GFR calc non Af Amer: >60 mL/min (ref >60)
Glucose, Bld: 126 mg/dL — ABNORMAL HIGH (ref 70–99)
Potassium: 3.9 mmol/L (ref 3.5–5.1)
Sodium: 135 mmol/L (ref 135–145)
Total Bilirubin: 1.1 mg/dL (ref 0.3–1.2)
Total Protein: 7.2 g/dL (ref 6.5–8.1)

## 2019-02-15 LAB — URINALYSIS, ROUTINE W REFLEX MICROSCOPIC
Bacteria, UA: NONE SEEN
Bilirubin Urine: NEGATIVE
Glucose, UA: NEGATIVE mg/dL
Ketones, ur: NEGATIVE mg/dL
Leukocytes,Ua: NEGATIVE
Nitrite: NEGATIVE
Protein, ur: NEGATIVE mg/dL
Specific Gravity, Urine: 1.016 (ref 1.005–1.030)
pH: 5 (ref 5.0–8.0)

## 2019-02-15 LAB — TROPONIN I (HIGH SENSITIVITY)
Troponin I (High Sensitivity): 6 ng/L (ref <18)
Troponin I (High Sensitivity): 6 ng/L (ref <18)
Troponin I (High Sensitivity): 5 ng/L (ref <18)
Troponin I (High Sensitivity): 5 ng/L (ref <18)

## 2019-02-15 LAB — FERRITIN: Ferritin: 54 ng/mL (ref 24–336)

## 2019-02-15 LAB — BRAIN NATRIURETIC PEPTIDE: B Natriuretic Peptide: 64.9 pg/mL (ref 0.0–100.0)

## 2019-02-15 LAB — C-REACTIVE PROTEIN: CRP: 6.2 mg/dL — ABNORMAL HIGH (ref <1.0)

## 2019-02-15 LAB — LACTATE DEHYDROGENASE: LDH: 95 U/L — ABNORMAL LOW (ref 98–192)

## 2019-02-15 LAB — FIBRINOGEN: Fibrinogen: 629 mg/dL — ABNORMAL HIGH (ref 210–475)

## 2019-02-15 LAB — SARS CORONAVIRUS 2 (TAT 6-24 HRS): SARS Coronavirus 2: NEGATIVE

## 2019-02-15 LAB — LACTIC ACID, PLASMA
Lactic Acid, Venous: 0.9 mmol/L (ref 0.5–1.9)
Lactic Acid, Venous: 1.2 mmol/L (ref 0.5–1.9)

## 2019-02-15 LAB — PROCALCITONIN: Procalcitonin: <0.1 ng/mL

## 2019-02-15 LAB — TRIGLYCERIDES: Triglycerides: 55 mg/dL (ref <150)

## 2019-02-15 LAB — D-DIMER, QUANTITATIVE: D-Dimer, Quant: 1.05 ug/mL-FEU — ABNORMAL HIGH (ref 0.00–0.50)

## 2019-02-15 MED ORDER — ADULT MULTIVITAMIN W/MINERALS CH
1.0000 | ORAL_TABLET | Freq: Every day | ORAL | Status: DC
  Administered 2019-02-15 – 2019-02-17 (×3): 1 via ORAL
  Filled 2019-02-15 (×2): qty 1

## 2019-02-15 MED ORDER — HYDROCOD POLST-CPM POLST ER 10-8 MG/5ML PO SUER: 5.0000 mL | Freq: Two times a day (BID) | ORAL | Status: DC | PRN

## 2019-02-15 MED ORDER — SODIUM CHLORIDE 0.9 % IV BOLUS
1000.0000 mL | Freq: Once | INTRAVENOUS | Status: AC
  Administered 2019-02-15: 15:00:00 1000 mL via INTRAVENOUS

## 2019-02-15 MED ORDER — ROPINIROLE HCL 1 MG PO TABS
2.0000 mg | ORAL_TABLET | Freq: Once | ORAL | Status: AC
  Administered 2019-02-15: 17:00:00 2 mg via ORAL
  Filled 2019-02-15: qty 2

## 2019-02-15 MED ORDER — ALBUTEROL SULFATE (2.5 MG/3ML) 0.083% IN NEBU: 5.0000 mg | INHALATION_SOLUTION | Freq: Once | RESPIRATORY_TRACT | Status: DC

## 2019-02-15 MED ORDER — NICOTINE 21 MG/24HR TD PT24
21.0000 mg | MEDICATED_PATCH | Freq: Every day | TRANSDERMAL | Status: DC
  Administered 2019-02-17: 10:00:00 21 mg via TRANSDERMAL
  Filled 2019-02-15 (×3): qty 1

## 2019-02-15 MED ORDER — ALPRAZOLAM 0.25 MG PO TABS
0.2500 mg | ORAL_TABLET | Freq: Every day | ORAL | Status: DC | PRN
  Administered 2019-02-16: 0.25 mg via ORAL
  Filled 2019-02-15: qty 1

## 2019-02-15 MED ORDER — DULOXETINE HCL 30 MG PO CPEP
30.0000 mg | ORAL_CAPSULE | Freq: Every day | ORAL | Status: DC
  Administered 2019-02-15 – 2019-02-17 (×3): 30 mg via ORAL
  Filled 2019-02-15 (×3): qty 1

## 2019-02-15 MED ORDER — BENZONATATE 100 MG PO CAPS
100.0000 mg | ORAL_CAPSULE | Freq: Three times a day (TID) | ORAL | Status: DC
  Administered 2019-02-15 – 2019-02-17 (×5): 100 mg via ORAL
  Filled 2019-02-15 (×5): qty 1

## 2019-02-15 MED ORDER — METHYLPREDNISOLONE SODIUM SUCC 125 MG IJ SOLR
60.0000 mg | Freq: Four times a day (QID) | INTRAMUSCULAR | Status: DC
  Administered 2019-02-15 – 2019-02-16 (×2): 60 mg via INTRAVENOUS
  Filled 2019-02-15 (×2): qty 2

## 2019-02-15 MED ORDER — MAGNESIUM OXIDE 400 (241.3 MG) MG PO TABS: 400.0000 mg | ORAL_TABLET | Freq: Every day | ORAL | Status: DC | PRN

## 2019-02-15 MED ORDER — GUAIFENESIN ER 600 MG PO TB12
600.0000 mg | ORAL_TABLET | Freq: Two times a day (BID) | ORAL | Status: DC
  Administered 2019-02-15 – 2019-02-17 (×4): 600 mg via ORAL
  Filled 2019-02-15 (×4): qty 1

## 2019-02-15 MED ORDER — BUPROPION HCL ER (XL) 150 MG PO TB24
150.0000 mg | ORAL_TABLET | Freq: Every day | ORAL | Status: DC
  Administered 2019-02-15 – 2019-02-17 (×3): 150 mg via ORAL
  Filled 2019-02-15 (×3): qty 1

## 2019-02-15 MED ORDER — METHYLPREDNISOLONE SODIUM SUCC 125 MG IJ SOLR
125.0000 mg | Freq: Once | INTRAMUSCULAR | Status: AC
  Administered 2019-02-15: 16:00:00 125 mg via INTRAVENOUS
  Filled 2019-02-15: qty 2

## 2019-02-15 MED ORDER — ACETAMINOPHEN 650 MG RE SUPP: 650.0000 mg | Freq: Four times a day (QID) | RECTAL | Status: DC | PRN

## 2019-02-15 MED ORDER — VITAMIN C 500 MG PO TABS
500.0000 mg | ORAL_TABLET | Freq: Every day | ORAL | Status: DC
  Administered 2019-02-15 – 2019-02-17 (×3): 500 mg via ORAL
  Filled 2019-02-15 (×3): qty 1

## 2019-02-15 MED ORDER — ROPINIROLE HCL 1 MG PO TABS
4.0000 mg | ORAL_TABLET | Freq: Every day | ORAL | Status: DC
  Administered 2019-02-15 – 2019-02-16 (×2): 4 mg via ORAL
  Filled 2019-02-15 (×2): qty 4

## 2019-02-15 MED ORDER — ONDANSETRON HCL 4 MG/2ML IJ SOLN: 4.0000 mg | Freq: Four times a day (QID) | INTRAMUSCULAR | Status: DC | PRN

## 2019-02-15 MED ORDER — ADULT MULTIVITAMIN W/MINERALS CH: 1.0000 | ORAL_TABLET | Freq: Every day | ORAL | Status: DC

## 2019-02-15 MED ORDER — ACETAMINOPHEN 325 MG PO TABS: 650.0000 mg | ORAL_TABLET | Freq: Four times a day (QID) | ORAL | Status: DC | PRN

## 2019-02-15 MED ORDER — ONDANSETRON HCL 4 MG PO TABS: 4.0000 mg | ORAL_TABLET | Freq: Four times a day (QID) | ORAL | Status: DC | PRN

## 2019-02-15 MED ORDER — ALBUTEROL SULFATE HFA 108 (90 BASE) MCG/ACT IN AERS
1.0000 | INHALATION_SPRAY | RESPIRATORY_TRACT | Status: DC
  Administered 2019-02-15 – 2019-02-16 (×6): 2 via RESPIRATORY_TRACT
  Filled 2019-02-15: qty 6.7

## 2019-02-15 MED ORDER — ALBUTEROL SULFATE HFA 108 (90 BASE) MCG/ACT IN AERS
2.0000 | INHALATION_SPRAY | Freq: Once | RESPIRATORY_TRACT | Status: AC
  Administered 2019-02-15: 15:00:00 2 via RESPIRATORY_TRACT
  Filled 2019-02-15: qty 6.7

## 2019-02-15 MED ORDER — MOMETASONE FURO-FORMOTEROL FUM 100-5 MCG/ACT IN AERO
2.0000 | INHALATION_SPRAY | Freq: Two times a day (BID) | RESPIRATORY_TRACT | Status: DC
  Administered 2019-02-16 – 2019-02-17 (×2): 2 via RESPIRATORY_TRACT
  Filled 2019-02-15: qty 8.8

## 2019-02-15 MED ORDER — ENOXAPARIN SODIUM 60 MG/0.6ML ~~LOC~~ SOLN
60.0000 mg | Freq: Every day | SUBCUTANEOUS | Status: DC
  Administered 2019-02-15 – 2019-02-16 (×2): 60 mg via SUBCUTANEOUS
  Filled 2019-02-15 (×2): qty 0.6

## 2019-02-15 MED ORDER — PANTOPRAZOLE SODIUM 40 MG PO TBEC
40.0000 mg | DELAYED_RELEASE_TABLET | Freq: Every day | ORAL | Status: DC
  Administered 2019-02-17: 40 mg via ORAL
  Filled 2019-02-15 (×2): qty 1

## 2019-02-15 MED ORDER — ROPINIROLE HCL 0.5 MG PO TABS
0.5000 mg | ORAL_TABLET | Freq: Every day | ORAL | Status: DC | PRN
  Administered 2019-02-16: 14:00:00 2 mg via ORAL
  Filled 2019-02-15 (×2): qty 4

## 2019-02-15 MED ORDER — GUAIFENESIN-DM 100-10 MG/5ML PO SYRP: 5.0000 mL | ORAL_SOLUTION | ORAL | Status: DC | PRN

## 2019-02-15 NOTE — ED Triage Notes (Signed)
Pt  States since Tuesday night he has had on and off fever, cough, chest tightness. He was tested on Friday for Covid, Negative results. States fever has been as high as 102.4 now 98.4, has been taking Tylenol for fever, las dose 1.5 hr ago

## 2019-02-15 NOTE — ED Provider Notes (Signed)
[facility] DEPT Provider Note   CSN: [id number] Arrival date & time: 02/15/19  1413     History   Chief Complaint Chief Complaint  Patient presents with  . Fever  . Shortness of Breath  . Cough    HPI Patrick [name] is a 53 y.o. male history of previous tobacco use, restless leg syndrome here presenting with shortness of breath, fever, loss of taste. Patient has been having cough and shortness of breath for the last 5 days.  Patient talked to his doctor and had a virtual visit and was tested for Covid 2 days ago that was negative.  However for the last 2 to 3 days, patient has been running persistent fevers as high as 102 daily.  He has been taking Tylenol for fever.  Today he states that he has worsening shortness of breath and some left-sided chest pressure.  Patient did take some Tylenol prior to arrival.  Patient denies any sick contacts with Covid.  Denies any urinary symptoms or abdominal pain or vomiting.      The history is provided by the patient.    Past Medical History:  Diagnosis Date  . Arthritis   . Blood transfusion without reported diagnosis 1984   20+ units  . Renal disorder   . RLS (restless legs syndrome)   . Tobacco use     Patient Active Problem List   Diagnosis Date Noted  . Venous ulcer ([place]) 11/17/2018  . 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 Venous stasis ulcer of ankle, left ([place]) 10/07/2018  . Morbid obesity ([place]) 10/07/2018  . 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 Obesity 02/23/2014  . Impaired glucose tolerance 02/23/2014  . Chest pain 08/29/2010  . DEHYDRATION 11/24/2009  . TOBACCO USER 11/24/2009  . GASTROENTERITIS, ACUTE 11/24/2009  . ABDOMINAL PAIN, EPIGASTRIC 11/24/2009  . BENIGN PROSTATIC HYPERTROPHY, MILD, HX OF 10/28/2009  . Essential hypertension 05/25/2008  . BIPOLAR DISORDER UNSPECIFIED 05/02/2007  . RESTLESS LEG SYNDROME 10/21/2006  . GERD 10/21/2006     Past Surgical History:  Procedure Laterality Date  . arm surgery  1984   MVI  . CARDIAC SURGERY  1984   MVI  . CHOLECYSTECTOMY  ?2007  . JOINT REPLACEMENT    . KIDNEY SURGERY  1984   MVI  . MANDIBLE FRACTURE SURGERY  1984   MVI  . TOTAL HIP ARTHROPLASTY Left 09/07/2016   Procedure: TOTAL HIP ARTHROPLASTY;  Surgeon: Earlie Server, MD;  Location: [facility];  Service: Orthopedics;  Laterality: Left;        Home Medications    Prior to Admission medications   Medication Sig Start Date End Date Taking? Authorizing Provider  acetaminophen (TYLENOL) 500 MG tablet Take 1,500 mg by mouth every 6 (six) hours as needed for mild pain.    [provider]  ALPRAZolam Duanne Moron) 0.25 MG tablet TAKE 1 TABLET BY MOUTH ONCE DAILY AS NEEDED FOR ANXIETY 11/13/18   Isaac Bliss, Rayford Halsted, MD  buPROPion (WELLBUTRIN XL) 150 MG 24 hr tablet Take 1 tablet (150 mg total) by mouth daily. 02/11/19   Isaac Bliss, Rayford Halsted, MD  DULoxetine (CYMBALTA) 30 MG capsule Take 1 capsule (30 mg total) by mouth daily. 12/31/18   Isaac Bliss, Rayford Halsted, MD  Glucosamine-Chondroitin (OSTEO BI-FLEX REGULAR STRENGTH PO) Take by mouth.    [provider]  ibuprofen (ADVIL) 200 MG tablet Take 400 mg by mouth every 6 (six) hours as  needed for mild pain.    [provider]  magnesium oxide (MAG-OX) 400 MG tablet Take 400 mg by mouth daily.    [provider]  Misc Natural Products (FOCUSED MIND PO) Take by mouth.    [provider]  Multiple Vitamin (ONE-A-DAY MENS PO) Take by mouth.    [provider]  Omega-3 Fatty Acids (FISH OIL PO) Take by mouth.    [provider]  rOPINIRole (REQUIP) 1 MG tablet Take half tab daily as needed 10/07/18   Isaac Bliss, Rayford Halsted, MD  rOPINIRole (REQUIP) 4 MG tablet Take 1 tablet (4 mg total) by mouth at bedtime. 10/07/18   Isaac Bliss, Rayford Halsted, MD  UNABLE TO FIND Med Name: Restful leg OTC Hyland's    [provider]    Family History Family History  Problem Relation Age of Onset  . Leukemia Father 41       Due to agent orange, deceased  . Hearing loss Mother        Living  . Diabetes Brother   . Obesity Brother   . Healthy Brother   . Alcohol abuse Sister   . Colon cancer Neg Hx   . Esophageal cancer Neg Hx   . Stomach cancer Neg Hx   . Rectal cancer Neg Hx   . Colon polyps Neg Hx     Social History Social History   Tobacco Use  . Smoking status: Current Every Day Smoker    Packs/day: 0.75    Years: 35.00    Pack years: 26.25    Types: Cigarettes  . Smokeless tobacco: Never Used  . Tobacco comment: cut back from 1 pack to 1/2 pack day - 35x years  Substance Use Topics  . Alcohol use: Yes    Alcohol/week: 2.0 standard drinks    Types: 2 Cans of beer per week  . Drug use: No     Allergies   Banana   Review of Systems Review of Systems  Constitutional: Positive for fever.  Respiratory: Positive for cough and shortness of breath.   All other systems reviewed and are negative.    Physical Exam Updated Vital Signs BP (!) 147/94   Pulse 94   Temp 98.5 F (36.9 C) (Oral)   Resp (!) 26   Ht 5\' 9"  (1.753 m)   Wt 120.2 kg   SpO2 92%   BMI 39.13 kg/m   Physical Exam Vitals signs and nursing note reviewed.  HENT:     Head: Normocephalic.     Mouth/Throat:     Pharynx: Oropharynx is clear.  Eyes:     Extraocular Movements: Extraocular movements intact.     Pupils: Pupils are equal, round, and reactive to light.  Neck:     Musculoskeletal: Normal range of motion.  Cardiovascular:     Rate and Rhythm: Normal rate and regular rhythm.  Pulmonary:     Comments: Diminished throughout, no obvious wheezing or crackles  Abdominal:     General: Bowel sounds are normal.     Palpations: Abdomen is soft.  Musculoskeletal: Normal range of motion.  Skin:    General: Skin is warm.     Capillary Refill: Capillary refill takes less than 2 seconds.   Neurological:     General: No focal deficit present.     Mental Status: He is alert and oriented to person, place, and time.  Psychiatric:        Mood and Affect: Mood normal.  Behavior: Behavior normal.      ED Treatments / Results  Labs (all labs ordered are listed, but only abnormal results are displayed) Labs Reviewed  CBC WITH DIFFERENTIAL/PLATELET - Abnormal; Notable for the following components:      Result Value   WBC 11.2 (*)    All other components within normal limits  COMPREHENSIVE METABOLIC PANEL - Abnormal; Notable for the following components:   Glucose, Bld 126 (*)    Calcium 8.4 (*)    Albumin 3.4 (*)    AST 14 (*)    All other components within normal limits  D-DIMER, QUANTITATIVE (NOT AT [facility]) - Abnormal; Notable for the following components:   D-Dimer, Quant 1.05 (*)    All other components within normal limits  LACTATE DEHYDROGENASE - Abnormal; Notable for the following components:   LDH 95 (*)    All other components within normal limits  FIBRINOGEN - Abnormal; Notable for the following components:   Fibrinogen 629 (*)    All other components within normal limits  C-REACTIVE PROTEIN - Abnormal; Notable for the following components:   CRP 6.2 (*)    All other components within normal limits  CULTURE, BLOOD (ROUTINE X 2)  CULTURE, BLOOD (ROUTINE X 2)  URINE CULTURE  SARS CORONAVIRUS 2 (TAT 6-24 HRS)  LACTIC ACID, PLASMA  PROCALCITONIN  FERRITIN  LACTIC ACID, PLASMA  URINALYSIS, ROUTINE W REFLEX MICROSCOPIC  TRIGLYCERIDES  TROPONIN I (HIGH SENSITIVITY)  TROPONIN I (HIGH SENSITIVITY)    EKG EKG Interpretation  Date/Time:  Sunday February 15 2019 14:41:48 EST Ventricular Rate:  95 PR Interval:    QRS Duration: 89 QT Interval:  353 QTC Calculation: 444 R Axis:   34 Text Interpretation: Sinus rhythm Inferolateral infarct, acute Borderline ST elevation, anterior leads J point elevation Confirmed by Wandra Arthurs [id number]) on 02/15/2019  2:49:10 PM   Radiology Dg Chest Port 1 View  Result Date: 02/15/2019 CLINICAL DATA:  Shortness of breath, fever, cough EXAM: PORTABLE CHEST 1 VIEW COMPARISON:  08/27/2016 FINDINGS: Cardiomegaly. Unchanged elevation of the left hemidiaphragm. The visualized skeletal structures are unremarkable. IMPRESSION: Cardiomegaly. Unchanged elevation of the left hemidiaphragm without acute appearing airspace opacity. Electronically Signed   By: Eddie Candle M.D.   On: 02/15/2019 16:24    Procedures Procedures (including critical care time)  CRITICAL CARE Performed by: Wandra Arthurs   Total critical care time: 30 minutes  Critical care time was exclusive of separately billable procedures and treating other patients.  Critical care was necessary to treat or prevent imminent or life-threatening deterioration.  Critical care was time spent personally by me on the following activities: development of treatment plan with patient and/or surrogate as well as nursing, discussions with consultants, evaluation of patient's response to treatment, examination of patient, obtaining history from patient or surrogate, ordering and performing treatments and interventions, ordering and review of laboratory studies, ordering and review of radiographic studies, pulse oximetry and re-evaluation of patient's condition.   Medications Ordered in ED Medications  sodium chloride 0.9 % bolus 1,000 mL (1,000 mLs Intravenous Bolus from Bag 02/15/19 1520)  albuterol (VENTOLIN HFA) 108 (90 Base) MCG/ACT inhaler 2 puff (2 puffs Inhalation Given 02/15/19 1523)  methylPREDNISolone sodium succinate (SOLU-MEDROL) 125 mg/2 mL injection 125 mg (125 mg Intravenous Given 02/15/19 1543)  rOPINIRole (REQUIP) tablet 2 mg (2 mg Oral Given 02/15/19 1722)     Initial Impression / Assessment and Plan / ED Course  I have reviewed the triage vital signs and the  nursing notes.  Pertinent labs & imaging results that were available during my care  of the patient were reviewed by me and considered in my medical decision making (see chart for details).       SERAPHIM LESNER is a 53 y.o. male here with cough, fever. Fever 102 at home with nonproductive cough. Had one negative COVID test 2 days ago. I think likely COVID vs pneumonia vs COPD. Will get labs, COVID preadmission order set, CXR.   5:30 PM Inflammatory markers elevated. CXR clear. COVID pending but I think likely he had COVID. Desat to about 89% on RA when ambulating and became short of breath. Given IV steroids for possible asthma. Will admit for COVID vs asthma exacerbation with hypoxia.    Final Clinical Impressions(s) / ED Diagnoses   Final diagnoses:  SOB (shortness of breath)    ED Discharge Orders    None       Drenda Freeze, MD 02/15/19 1732

## 2019-02-15 NOTE — ED Notes (Signed)
Patient SpO2 92-93% on RA. Hospitalist gave verbal order to place patient on 2 L of oxygen via [place]. Improvement to 97% on 2 L of O2.

## 2019-02-15 NOTE — ED Notes (Signed)
Patient assisted with repositioning and provided a meal. Patient is requesting Requip. MD made aware.

## 2019-02-15 NOTE — ED Notes (Signed)
Patient ambulated without assistance, O2 reading 89%-92% RA while ambulating. 95% RA once patient back in bed.

## 2019-02-15 NOTE — H&P (Signed)
History and Physical        Hospital Admission Note Date: 02/15/2019  Patient name: Patrick [name] Medical record number: [id number] Date of birth: 1965-11-06 Age: 53 y.o. Gender: male  PCP: Isaac Bliss, Rayford Halsted, MD    Patient coming from: home   I have reviewed all records in the [facility].    Chief Complaint:  Fevers, coughing, loss of taste, symptoms since last Tuesday, last 5 days  HPI: Patient is a 53 year old male with history of tobacco use, restless leg syndrome, anxiety presented to ED with shortness of breath, fevers, loss of taste for the last 5 days.  Patient reported that he and his fiance started having similar symptoms, got tested for Covid on 02/12/2019 which was negative.  Patient reports fevers of 100-101 F daily, has been taking Tylenol for fever persistently for last 2 to 3 days.  Patient reported loss of taste.  Today he started having worsening of shortness of breath and chest tightness.  Currently no chest pain.  Denies any prior sick contacts with Covid.  No abdominal pain, nausea vomiting or diarrhea. In ED, patient was noted to be somewhat wheezing, O2 sats 89% to 92% on room air while ambulating and 95% on room air at rest  COVID-19 test pending  ED work-up/course:  Temp 98.5, respiratory 22, BP 136/93, O2 sats 94% on room air at rest  Sodium 135, potassium 3.9, BUN 14, creatinine 0.7, AST 14, ALT 15, LDH 95, troponin VI, triglycerides 55, ferritin 54, CRP 6.2 mildly elevated, procalcitonin less than 0. D-dimer 1.05, fibrinogen 629  Review of Systems: Positives marked in 'bold' Constitutional:  fever, chills, diaphoresis, poor appetite and fatigue.  HEENT: Denies photophobia, eye pain, redness, hearing loss, ear pain, congestion, sore throat, rhinorrhea, sneezing, mouth sores, trouble swallowing, neck pain, neck stiffness and tinnitus.    Respiratory: Please see HPI Cardiovascular: Denies palpitations and leg swelling.  Chest tightness Gastrointestinal: Denies nausea, vomiting, abdominal pain, diarrhea, constipation, blood in stool and abdominal distention.+ Loss of taste Genitourinary: Denies dysuria, urgency, frequency, hematuria, flank pain and difficulty urinating.  Musculoskeletal: Denies myalgias, back pain, joint swelling, arthralgias and gait problem.  Skin: Denies pallor, rash and wound.  Neurological: Denies dizziness, seizures, syncope,  numbness and headaches. + Generalized weakness Hematological: Denies adenopathy. Easy bruising, personal or family bleeding history  Psychiatric/Behavioral: Denies suicidal ideation, mood changes, confusion, nervousness, sleep disturbance and agitation  Past Medical History: Past Medical History:  Diagnosis Date   Arthritis    Blood transfusion without reported diagnosis 1984   20+ units   Renal disorder    RLS (restless legs syndrome)    Tobacco use     Past Surgical History:  Procedure Laterality Date   arm surgery  1984   MVI   CARDIAC SURGERY  1984   MVI   CHOLECYSTECTOMY  ?2007   JOINT REPLACEMENT     KIDNEY SURGERY  1984   MVI   MANDIBLE FRACTURE SURGERY  1984   MVI   TOTAL HIP ARTHROPLASTY Left 09/07/2016   Procedure: TOTAL HIP ARTHROPLASTY;  Surgeon: Earlie Server, MD;  Location: [place];  Service: Orthopedics;  Laterality: Left;  Medications: Prior to Admission medications   Medication Sig Start Date End Date Taking? Authorizing Provider  acetaminophen (TYLENOL) 500 MG tablet Take 1,500 mg by mouth every 6 (six) hours as needed for mild pain.    [provider]  ALPRAZolam Duanne Moron) 0.25 MG tablet TAKE 1 TABLET BY MOUTH ONCE DAILY AS NEEDED FOR ANXIETY 11/13/18   Isaac Bliss, Rayford Halsted, MD  buPROPion (WELLBUTRIN XL) 150 MG 24 hr tablet Take 1 tablet (150 mg total) by mouth daily. 02/11/19   Isaac Bliss, Rayford Halsted, MD  DULoxetine  (CYMBALTA) 30 MG capsule Take 1 capsule (30 mg total) by mouth daily. 12/31/18   Isaac Bliss, Rayford Halsted, MD  Glucosamine-Chondroitin (OSTEO BI-FLEX REGULAR STRENGTH PO) Take by mouth.    [provider]  ibuprofen (ADVIL) 200 MG tablet Take 400 mg by mouth every 6 (six) hours as needed for mild pain.    [provider]  magnesium oxide (MAG-OX) 400 MG tablet Take 400 mg by mouth daily.    [provider]  Misc Natural Products (FOCUSED MIND PO) Take by mouth.    [provider]  Multiple Vitamin (ONE-A-DAY MENS PO) Take by mouth.    [provider]  Omega-3 Fatty Acids (FISH OIL PO) Take by mouth.    [provider]  rOPINIRole (REQUIP) 1 MG tablet Take half tab daily as needed 10/07/18   Isaac Bliss, Rayford Halsted, MD  rOPINIRole (REQUIP) 4 MG tablet Take 1 tablet (4 mg total) by mouth at bedtime. 10/07/18   Isaac Bliss, Rayford Halsted, MD  UNABLE TO FIND Med Name: Restful leg OTC [employer]    [provider]    Allergies:   Allergies  Allergen Reactions   Banana Nausea And Vomiting    Social History:  reports that he has been smoking cigarettes. He has a 26.25 pack-year smoking history. He has never used smokeless tobacco. He reports current alcohol use of about 2.0 standard drinks of alcohol per week. He reports that he does not use drugs.  Family History: Family History  Problem Relation Age of Onset   Leukemia Father 57       Due to agent orange, deceased   Hearing loss Mother        Living   Diabetes Brother    Obesity Brother    Healthy Brother    Alcohol abuse Sister    Colon cancer Neg Hx    Esophageal cancer Neg Hx    Stomach cancer Neg Hx    Rectal cancer Neg Hx    Colon polyps Neg Hx     Physical Exam: Blood pressure (!) 147/94, pulse 94, temperature 98.5 F (36.9 C), temperature source Oral, resp. rate (!) 26, height 5\' 9"  (1.753 m), weight 120.2 kg, SpO2 92 %. General: Alert, awake,  oriented x3, in no acute distress, coughing and congested. Eyes: pink conjunctiva,anicteric sclera, pupils equal and reactive to light and accomodation, HEENT: normocephalic, atraumatic, oropharynx clear Neck: supple, no masses or lymphadenopathy, no goiter, no bruits, no JVD CVS: Regular rate and rhythm, without murmurs, rubs or gallops. No lower extremity edema Resp : Mild expiratory wheezing bilaterally GI : Soft, nontender, nondistended, positive bowel sounds, no masses. No hepatomegaly. No hernia.  Musculoskeletal: No clubbing or cyanosis, positive pedal pulses. No contracture. ROM intact  Neuro: Grossly intact, no focal neurological deficits, strength 5/5 upper and lower extremities bilaterally Psych: alert and oriented x 3, normal mood and affect Skin: no rashes or lesions, warm and  dry   LABS on Admission: I have personally reviewed all the labs and imagings below    Basic Metabolic Panel: Recent Labs  Lab 02/15/19 1450  NA 135  K 3.9  CL 100  CO2 25  GLUCOSE 126*  BUN 14  CREATININE 0.71  CALCIUM 8.4*   Liver Function Tests: Recent Labs  Lab 02/15/19 1450  AST 14*  ALT 15  ALKPHOS 56  BILITOT 1.1  PROT 7.2  ALBUMIN 3.4*   No results for input(s): LIPASE, AMYLASE in the last 168 hours. No results for input(s): AMMONIA in the last 168 hours. CBC: Recent Labs  Lab 02/15/19 1450  WBC 11.2*  NEUTROABS 7.6  HGB 13.1  HCT 41.2  MCV 92.6  PLT 227   Cardiac Enzymes: No results for input(s): CKTOTAL, CKMB, CKMBINDEX, TROPONINI in the last 168 hours. BNP: Invalid input(s): POCBNP CBG: No results for input(s): GLUCAP in the last 168 hours.  Radiological Exams on Admission:  Dg Chest Port 1 View  Result Date: 02/15/2019 CLINICAL DATA:  Shortness of breath, fever, cough EXAM: PORTABLE CHEST 1 VIEW COMPARISON:  08/27/2016 FINDINGS: Cardiomegaly. Unchanged elevation of the left hemidiaphragm. The visualized skeletal structures are unremarkable. IMPRESSION:  Cardiomegaly. Unchanged elevation of the left hemidiaphragm without acute appearing airspace opacity. Electronically Signed   By: Eddie Candle M.D.   On: 02/15/2019 16:24      EKG: Independently reviewed.  Rate 95, J-point elevation in lateral leads, Q waves 2 3 aVF   Assessment/Plan Principal Problem:   Acute respiratory failure with hypoxia (HCC) -Presented with persistent fevers, loss of taste, coughing, generalized weakness, has mild wheezing.  Recent Covid test on 11/5 negative, denies any close sick contacts, symptoms persistent since Tuesday. -Smoking history, may have underlying COPD however strong suspicion of possible COVID-19 respiratory disease.  COVID-19 test pending, admit as PUI. -Chest x-ray showed cardiomegaly with unchanged elevation of left hemidiaphragm without acute appearing airspace opacity - D-dimer, CRP, fibrinogen elevated, procalcitonin less than 0.1, obtain BNP - will start patient on IV Solu-Medrol (will change to dexamethasone if Covid positive).  Symptomatic treatment with antitussives, multivitamin, albuterol inhaler, Dulera, O2 as tolerated, Lovenox -As patient does have hypoxia on ambulation, if Covid positive will qualify for remdesivir protocol - will place on airborne and contact precaution (hypoxic, coughing, strong suspicion for COVID-19) -If COVID-19 test negative, will obtain CT angiogram chest to rule out PE (elevated D-dimer)  Active Problems:   BIPOLAR DISORDER UNSPECIFIED -Continue Wellbutrin, Cymbalta, Xanax as needed    TOBACCO USER -Patient counseled on smoking cessation, placed on nicotine patch     GERD - will place on PPI  Abnormal EKG - will repeat EKG, troponin x3.  Currently no chest pain -EKG showed J-point elevation in lateral leads with Q waves in inferior leads (similar to previous EKG in 08/2016)  DVT prophylaxis: Lovenox  CODE STATUS: Full CODE STATUS  Consults called: None  Family Communication: Admission, patients  condition and plan of care including tests being ordered have been discussed with the patient who indicates understanding and agree with the plan and Code Status  Admission status: Inpatient, telemetry  The medical decision making on this patient was of high complexity and the patient is at high risk for clinical deterioration, therefore this is a level 3 admission.  Severity of Illness:      The appropriate patient status for this patient is INPATIENT. Inpatient status is judged to be reasonable and necessary in order to provide the required intensity  of service to ensure the patient's safety. The patient's presenting symptoms, physical exam findings, and initial radiographic and laboratory data in the context of their chronic comorbidities is felt to place them at high risk for further clinical deterioration. Furthermore, it is not anticipated that the patient will be medically stable for discharge from the hospital within 2 midnights of admission. The following factors support the patient status of inpatient.   " The patient's presenting symptoms include shortness of breath, persistent coughing and fevers, loss of taste " The worrisome physical exam findings include mild wheezing bilaterally, hypoxia, dyspnea " The initial radiographic and laboratory data are worrisome because of COVID-19 test pending however inflammatory markers elevated " The chronic co-morbidities include smoking   * I certify that at the point of admission it is my clinical judgment that the patient will require inpatient hospital care spanning beyond 2 midnights from the point of admission due to high intensity of service, high risk for further deterioration and high frequency of surveillance required.*    Time Spent on Admission: 59mins      Nayely Dingus M.D. [facility] 02/15/2019, 6:34 PM

## 2019-02-16 ENCOUNTER — Inpatient Hospital Stay (HOSPITAL_COMMUNITY): Payer: BLUE CROSS/BLUE SHIELD

## 2019-02-16 ENCOUNTER — Encounter (HOSPITAL_COMMUNITY): Payer: Self-pay

## 2019-02-16 DIAGNOSIS — R079 Chest pain, unspecified: Secondary | ICD-10-CM

## 2019-02-16 DIAGNOSIS — I10 Essential (primary) hypertension: Secondary | ICD-10-CM

## 2019-02-16 LAB — RESPIRATORY PANEL BY PCR

## 2019-02-16 LAB — BASIC METABOLIC PANEL
Anion gap: 8 (ref 5–15)
BUN: 17 mg/dL (ref 6–20)
CO2: 27 mmol/L (ref 22–32)
Calcium: 9 mg/dL (ref 8.9–10.3)
Chloride: 102 mmol/L (ref 98–111)
Creatinine, Ser: 0.79 mg/dL (ref 0.61–1.24)
GFR calc Af Amer: >60 mL/min (ref >60)
GFR calc non Af Amer: >60 mL/min (ref >60)
Glucose, Bld: 155 mg/dL — ABNORMAL HIGH (ref 70–99)
Potassium: 4.3 mmol/L (ref 3.5–5.1)
Sodium: 137 mmol/L (ref 135–145)

## 2019-02-16 LAB — SEDIMENTATION RATE: Sed Rate: 48 mm/hr — ABNORMAL HIGH (ref 0–16)

## 2019-02-16 LAB — CBC
HCT: 43.9 % (ref 39.0–52.0)
Hemoglobin: 13.3 g/dL (ref 13.0–17.0)
MCH: 29.6 pg (ref 26.0–34.0)
MCHC: 30.3 g/dL (ref 30.0–36.0)
MCV: 97.8 fL (ref 80.0–100.0)
Platelets: 234 10*3/uL (ref 150–400)
RBC: 4.49 MIL/uL (ref 4.22–5.81)
RDW: 13.5 % (ref 11.5–15.5)
WBC: 8.7 10*3/uL (ref 4.0–10.5)
nRBC: 0 % (ref 0.0–0.2)

## 2019-02-16 LAB — HIV ANTIBODY (ROUTINE TESTING W REFLEX): HIV Screen 4th Generation wRfx: NONREACTIVE

## 2019-02-16 LAB — ECHOCARDIOGRAM COMPLETE
Height: 68 in
Weight: 4246.94 oz

## 2019-02-16 LAB — INFLUENZA PANEL BY PCR (TYPE A & B)
Influenza A By PCR: NEGATIVE
Influenza B By PCR: NEGATIVE

## 2019-02-16 MED ORDER — DOXYCYCLINE HYCLATE 100 MG PO TABS
100.0000 mg | ORAL_TABLET | Freq: Two times a day (BID) | ORAL | Status: DC
  Administered 2019-02-16 – 2019-02-17 (×3): 100 mg via ORAL
  Filled 2019-02-16 (×3): qty 1

## 2019-02-16 MED ORDER — COLCHICINE 0.6 MG PO TABS
0.6000 mg | ORAL_TABLET | Freq: Every day | ORAL | Status: DC
  Administered 2019-02-17: 0.6 mg via ORAL
  Filled 2019-02-16: qty 1

## 2019-02-16 MED ORDER — SODIUM CHLORIDE (PF) 0.9 % IJ SOLN
INTRAMUSCULAR | Status: AC
  Administered 2019-02-16: 01:00:00
  Filled 2019-02-16: qty 50

## 2019-02-16 MED ORDER — INDOMETHACIN 25 MG PO CAPS
50.0000 mg | ORAL_CAPSULE | Freq: Three times a day (TID) | ORAL | Status: DC
  Administered 2019-02-16 – 2019-02-17 (×3): 50 mg via ORAL
  Filled 2019-02-16 (×4): qty 2

## 2019-02-16 MED ORDER — ALBUTEROL SULFATE HFA 108 (90 BASE) MCG/ACT IN AERS
1.0000 | INHALATION_SPRAY | Freq: Two times a day (BID) | RESPIRATORY_TRACT | Status: DC
  Administered 2019-02-17: 2 via RESPIRATORY_TRACT

## 2019-02-16 MED ORDER — IOHEXOL 350 MG/ML SOLN
100.0000 mL | Freq: Once | INTRAVENOUS | Status: AC | PRN
  Administered 2019-02-16: 01:00:00 100 mL via INTRAVENOUS

## 2019-02-16 MED ORDER — COLCHICINE 0.6 MG PO TABS
1.2000 mg | ORAL_TABLET | Freq: Once | ORAL | Status: AC
  Administered 2019-02-16: 14:00:00 1.2 mg via ORAL
  Filled 2019-02-16: qty 2

## 2019-02-16 MED ORDER — METHYLPREDNISOLONE SODIUM SUCC 125 MG IJ SOLR: 60.0000 mg | Freq: Two times a day (BID) | INTRAMUSCULAR | Status: DC

## 2019-02-16 MED ORDER — ORAL CARE MOUTH RINSE
15.0000 mL | Freq: Two times a day (BID) | OROMUCOSAL | Status: DC
  Administered 2019-02-17: 15 mL via OROMUCOSAL

## 2019-02-16 NOTE — ED Notes (Signed)
Patient transported to CT 

## 2019-02-16 NOTE — Plan of Care (Signed)

## 2019-02-16 NOTE — Progress Notes (Signed)
  Echocardiogram 2D Echocardiogram has been performed.  Azaliah Carrero G Donny Heffern 02/16/2019, 12:21 PM

## 2019-02-16 NOTE — Progress Notes (Signed)
[facility]                                                                              Patient Demographics  Patrick [name], is a 53 y.o. male, DOB - 10/18/1965, PPI:[id number]  Admit date - 02/15/2019   Admitting Physician Rise Patience, MD  Outpatient Primary MD for the patient is Isaac Bliss, Rayford Halsted, MD  Outpatient specialists:   LOS - 1  days   Medical records reviewed and are as summarized below:    Chief Complaint  Patient presents with   Fever   Shortness of Breath   Cough       Brief summary   Patient is a 53 year old male with history of tobacco use, restless leg syndrome, anxiety presented to ED with shortness of breath, fevers, loss of taste for the last 5 days.  Patient reported that he and his fiance started having similar symptoms, got tested for Covid on 02/12/2019 which was negative.  Patient reported fevers of 100-101 F daily, has been taking Tylenol for fever persistently for last 2 to 3 days. Patient reported loss of taste. On day of admission, he started having worsening of shortness of breath and chest tightness.  No abdominal pain nausea vomiting or diarrhea In ED, patient was noted to be somewhat wheezing, O2 sats 89% to 92% on room air while ambulating and 95% on room air at rest. COVID-19 test negative Influenza panel negative  Assessment & Plan    Principal Problem:   Acute respiratory failure with hypoxia (HCC) likely due to COPD exacerbation, possibly has clinical pneumonia -COVID-19 test negative, influenza panel negative, follow respiratory virus panel -Given smoking history, likely has underlying COPD.  CT angiogram chest showed no PE. -Bilateral lungs coarse breath sounds  -Continue antitussives, multivitamin, albuterol, Dulera, O2 as tolerated -Started on doxycycline. -Recommended patient to get his fiance tested for Covid to rule out.   Active Problems: Abnormal EKG: Likely due to acute  pericarditis -Troponins negative, however repeat EKG also shows diffuse ST elevations, suspicion high for acute pericarditis due to fevers, shortness of breath, chest tightness, subacute persistent symptoms -Discussed with Dr. Stanford Breed, agree with the EKG findings and likely viral etiology caused acute pericarditis - will start indomethacin 50 mg 3 times daily, taper over the next 2 to 3 weeks -  continue PPI, colchicine for 3 months -Obtain 2D echocardiogram to rule out any pericardial effusion -Obtain ESR, CRP was elevated at the time of admission     BIPOLAR DISORDER UNSPECIFIED -Continue Wellbutrin, Cymbalta, Xanax as needed    TOBACCO USER -Counseled on smoking cessation, continue nicotine patch    GERD Continue PPI  Abnormal EKG -Troponins negative, however repeat EKG    Code Status: Full code DVT Prophylaxis: Lovenox Family Communication: Discussed all imaging results, lab results, explained to the patient   Disposition Plan: Await 2D echocardiogram, if improving, possible DC in a.m.  Time Spent in minutes 45 minutes  Procedures:  CT angiogram of the chest  Consultants:   Cardiology via phone consultation, Dr. Stanford Breed  Antimicrobials:   Anti-infectives (From admission, onward)   Start  Dose/Rate Route Frequency Ordered Stop   02/16/19 1000  doxycycline (VIBRA-TABS) tablet 100 mg     100 mg Oral Every 12 hours 02/16/19 0641            Medications  Scheduled Meds:  albuterol  1-2 puff Inhalation Q4H   benzonatate  100 mg Oral TID   buPROPion  150 mg Oral Daily   doxycycline  100 mg Oral Q12H   DULoxetine  30 mg Oral Daily   enoxaparin (LOVENOX) injection  60 mg Subcutaneous QHS   guaiFENesin  600 mg Oral BID   [START ON 02/17/2019] mouth rinse  15 mL Mouth Rinse BID   methylPREDNISolone (SOLU-MEDROL) injection  60 mg Intravenous Q6H   mometasone-formoterol  2 puff Inhalation BID   multivitamin with minerals  1 tablet Oral Daily    nicotine  21 mg Transdermal Daily   pantoprazole  40 mg Oral Q0600   rOPINIRole  4 mg Oral QHS   vitamin C  500 mg Oral Daily   Continuous Infusions: PRN Meds:.acetaminophen **OR** acetaminophen, acetaminophen, ALPRAZolam, chlorpheniramine-HYDROcodone, guaiFENesin-dextromethorphan, magnesium oxide, ondansetron **OR** ondansetron (ZOFRAN) IV, rOPINIRole      Subjective:   Patrick [name] was seen and examined today.  Shortness of breath slightly better however coarse breath sounds, no fevers this morning.  Chest pain improving. Patient denies dizziness,  abdominal pain, N/V/D/C, new weakness, numbess, tingling. No acute events overnight.  Coughing, states cannot bring anything up  Objective:   Vitals:   02/16/19 0000 02/16/19 0127 02/16/19 0128 02/16/19 0557  BP: (!) 151/99  134/78 120/66  Pulse: 98  74 75  Resp: '18  18 18  ' Temp:   98.3 F (36.8 C) 98.4 F (36.9 C)  TempSrc:   Oral Oral  SpO2: 92%  99% 93%  Weight:  120.4 kg    Height:  '5\' 8"'  (1.727 m)     No intake or output data in the 24 hours ending 02/16/19 1101   Wt Readings from Last 3 Encounters:  02/16/19 120.4 kg  01/02/19 119.3 kg  12/30/18 122.5 kg     Exam  General: Alert and oriented x 3, NAD  Eyes:  HEENT:  Atraumatic, normocephalic, normal oropharynx  Cardiovascular: S1 S2 auscultated, no murmurs, RRR  Respiratory: Coarse breath sounds bilaterally  Gastrointestinal: Soft, nontender, nondistended, + bowel sounds  Ext: no pedal edema bilaterally  Neuro: No new deficits  Musculoskeletal: No digital cyanosis, clubbing  Skin: No rashes  Psych: Normal affect and demeanor, alert and oriented x3    Data Reviewed:  I have personally reviewed following labs and imaging studies  Micro Results Recent Results (from the past 240 hour(s))  Novel Coronavirus, NAA ([facility])     Status: None   Collection Time: 02/12/19 12:00 AM   Specimen: Nasopharyngeal(NP) swabs in vial transport medium    NASOPHARYNGE  TESTING  Result Value Ref Range Status   SARS-CoV-2, NAA Not Detected Not Detected Final    Comment: This nucleic acid amplification test was developed and its performance characteristics determined by [employer]. Nucleic acid amplification tests include PCR and TMA. This test has not been FDA cleared or approved. This test has been authorized by FDA under an Emergency Use Authorization (EUA). This test is only authorized for the duration of time the declaration that circumstances exist justifying the authorization of the emergency use of in vitro diagnostic tests for detection of SARS-CoV-2 virus and/or diagnosis of COVID-19 infection under section 564(b)(1) of the Act, 21 U.S.C. 379KWI-0(X) (  1), unless the authorization is terminated or revoked sooner. When diagnostic testing is negative, the possibility of a false negative result should be considered in the context of a patient's recent exposures and the presence of clinical signs and symptoms consistent with COVID-19. An individual without symptoms of COVID-19 and who is not shedding SARS-CoV-2 virus would  expect to have a negative (not detected) result in this assay.   Blood culture (routine x 2)     Status: None (Preliminary result)   Collection Time: 02/15/19  1:40 PM   Specimen: BLOOD  Result Value Ref Range Status   Specimen Description   Final    BLOOD LEFT ANTECUBITAL Performed at [facility] [address] [place], [place] [address]    Special Requests   Final    BOTTLES DRAWN AEROBIC AND ANAEROBIC Blood Culture adequate volume Performed at [facility] [address], [place], [place] [address]    Culture   Final    NO GROWTH < 24 HOURS Performed at [facility] [address], [place], [place] [address]    Report Status PENDING  Incomplete  Blood culture (routine x 2)     Status: None (Preliminary result)   Collection Time: 02/15/19  2:51  PM   Specimen: BLOOD  Result Value Ref Range Status   Specimen Description   Final    BLOOD BLOOD RIGHT HAND Performed at [facility] [address], [place], [place] [address]    Special Requests   Final    BOTTLES DRAWN AEROBIC AND ANAEROBIC Blood Culture results may not be optimal due to an excessive volume of blood received in culture bottles Performed at [facility] [address] [place], [place] [address]    Culture   Final    NO GROWTH < 24 HOURS Performed at [facility] [address] [place], [place] [address]    Report Status PENDING  Incomplete  SARS CORONAVIRUS 2 (TAT 6-24 HRS) Nasopharyngeal Nasopharyngeal Swab     Status: None   Collection Time: 02/15/19  3:25 PM   Specimen: Nasopharyngeal Swab  Result Value Ref Range Status   SARS Coronavirus 2 NEGATIVE NEGATIVE Final    Comment: (NOTE) SARS-CoV-2 target nucleic acids are NOT DETECTED. The SARS-CoV-2 RNA is generally detectable in upper and lower respiratory specimens during the acute phase of infection. Negative results do not preclude SARS-CoV-2 infection, do not rule out co-infections with other pathogens, and should not be used as the sole basis for treatment or other patient management decisions. Negative results must be combined with clinical observations, patient history, and epidemiological information. The expected result is Negative. Fact Sheet for Patients: SugarRoll.be Fact Sheet for Healthcare Providers: https://www.woods-mathews.com/ This test is not yet approved or cleared by the [place] FDA and  has been authorized for detection and/or diagnosis of SARS-CoV-2 by FDA under an Emergency Use Authorization (EUA). This EUA will remain  in effect (meaning this test can be used) for the duration of the COVID-19 declaration under Section 56 4(b)(1) of the Act, 21 U.S.C. section 360bbb-3(b)(1), unless the  authorization is terminated or revoked sooner. Performed at [facility], [place] [address] [place],  [address]     Radiology Reports Ct Angio Chest Pe W Or Wo Contrast  Result Date: 02/16/2019 CLINICAL DATA:  Shortness of breath with fever and cough EXAM: CT ANGIOGRAPHY CHEST WITH CONTRAST TECHNIQUE: Multidetector CT imaging of the chest was  performed using the standard protocol during bolus administration of intravenous contrast. Multiplanar CT image reconstructions and MIPs were obtained to evaluate the vascular anatomy. 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 Normal tapering in the thoracic aortic arch is seen. The descending thoracic aorta is within limits. No dissection seen. Heart is not significantly enlarged. Coronary calcifications are noted. Pulmonary artery is normal limits. No focal filling defects are identified to suggest pulmonary embolism. Mediastinum/Nodes: Esophagus as visualized is within normal limits. No hilar or mediastinal adenopathy is noted. Thoracic inlet is within normal limits. Lungs/Pleura: Lungs are well aerated bilaterally. No focal infiltrate or sizable effusion is seen. Upper Abdomen: Visualized upper abdomen demonstrates cystic change of the right kidney stable from prior CT from 2007. Musculoskeletal: Degenerative changes of the thoracic spine are noted. Review of the MIP images confirms the above findings. IMPRESSION: No evidence of pulmonary emboli. 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 Recommend annual imaging followup by CTA or MRA. This recommendation follows 2010 ACCF/AHA/AATS/ACR/ASA/SCA/SCAI/SIR/STS/SVM Guidelines for the Diagnosis and Management of Patients with Thoracic Aortic Disease. Circulation. 2010; 121:: K122-E497 Aortic aneurysm NOS (ICD10-I71.9) No other focal abnormality is noted. Electronically  Signed   By: MInez CatalinaM.D.   On: 02/16/2019 01:22   Dg Chest Port 1 View  Result Date: 02/15/2019 CLINICAL DATA:  Shortness of breath, fever, cough EXAM: PORTABLE CHEST 1 VIEW COMPARISON:  08/27/2016 FINDINGS: Cardiomegaly. Unchanged elevation of the left hemidiaphragm. The visualized skeletal structures are unremarkable. IMPRESSION: Cardiomegaly. Unchanged elevation of the left hemidiaphragm without acute appearing airspace opacity. Electronically Signed   By: AEddie CandleM.D.   On: 02/15/2019 16:24    Lab Data:  CBC: Recent Labs  Lab 02/15/19 1450 02/16/19 0324  WBC 11.2* 8.7  NEUTROABS 7.6  --   HGB 13.1 13.3  HCT 41.2 43.9  MCV 92.6 97.8  PLT 227 2530  Basic Metabolic Panel: Recent Labs  Lab 02/15/19 1450 02/16/19 0324  NA 135 137  K 3.9 4.3  CL 100 102  CO2 25 27  GLUCOSE 126* 155*  BUN 14 17  CREATININE 0.71 0.79  CALCIUM 8.4* 9.0   GFR: Estimated Creatinine Clearance: 131.6 mL/min (by C-G formula based on SCr of 0.79 mg/dL). Liver Function Tests: Recent Labs  Lab 02/15/19 1450  AST 14*  ALT 15  ALKPHOS 56  BILITOT 1.1  PROT 7.2  ALBUMIN 3.4*   No results for input(s): LIPASE, AMYLASE in the last 168 hours. No results for input(s): AMMONIA in the last 168 hours. Coagulation Profile: No results for input(s): INR, PROTIME in the last 168 hours. Cardiac Enzymes: No results for input(s): CKTOTAL, CKMB, CKMBINDEX, TROPONINI in the last 168 hours. BNP (last 3 results) No results for input(s): PROBNP in the last 8760 hours. HbA1C: No results for input(s): HGBA1C in the last 72 hours. CBG: No results for input(s): GLUCAP in the last 168 hours. Lipid Profile: Recent Labs    02/15/19 1455  TRIG 55   Thyroid Function Tests: No results for input(s): TSH, T4TOTAL, FREET4, T3FREE, THYROIDAB in the last 72 hours. Anemia Panel: Recent Labs    02/15/19 1455  FERRITIN 54   Urine analysis:    Component Value Date/Time   COLORURINE YELLOW 02/15/2019  1[place]11/11/2018 1722   LABSPEC 1.016 02/15/2019 1722   PHURINE 5.0 02/15/2019 1722   GLUCOSEU NEGATIVE 02/15/2019 1722   HGBUR  SMALL (A) 02/15/2019 1722   BILIRUBINUR NEGATIVE 02/15/2019 1722   BILIRUBINUR neg 03/14/2016 1100   KETONESUR NEGATIVE 02/15/2019 1722   PROTEINUR NEGATIVE 02/15/2019 1722   UROBILINOGEN 0.2 03/14/2016 1100   NITRITE NEGATIVE 02/15/2019 1722   LEUKOCYTESUR NEGATIVE 02/15/2019 1722     Corban Kistler M.D. [facility] 02/16/2019, 11:01 AM  Pager: [phone] Between 7am to 7pm - call Pager - [phone]  After 7pm go to [email or web address] - password [email or web address]  Call night coverage person covering after 7pm

## 2019-02-17 LAB — BASIC METABOLIC PANEL
Anion gap: 9 (ref 5–15)
BUN: 24 mg/dL — ABNORMAL HIGH (ref 6–20)
CO2: 27 mmol/L (ref 22–32)
Calcium: 8.8 mg/dL — ABNORMAL LOW (ref 8.9–10.3)
Chloride: 102 mmol/L (ref 98–111)
Creatinine, Ser: 0.7 mg/dL (ref 0.61–1.24)
GFR calc Af Amer: >60 mL/min (ref >60)
GFR calc non Af Amer: >60 mL/min (ref >60)
Glucose, Bld: 111 mg/dL — ABNORMAL HIGH (ref 70–99)
Potassium: 3.9 mmol/L (ref 3.5–5.1)
Sodium: 138 mmol/L (ref 135–145)

## 2019-02-17 LAB — CBC
HCT: 39 % (ref 39.0–52.0)
Hemoglobin: 12.3 g/dL — ABNORMAL LOW (ref 13.0–17.0)
MCH: 29.3 pg (ref 26.0–34.0)
MCHC: 31.5 g/dL (ref 30.0–36.0)
MCV: 92.9 fL (ref 80.0–100.0)
Platelets: 224 10*3/uL (ref 150–400)
RBC: 4.2 MIL/uL — ABNORMAL LOW (ref 4.22–5.81)
RDW: 13.3 % (ref 11.5–15.5)
WBC: 14 10*3/uL — ABNORMAL HIGH (ref 4.0–10.5)
nRBC: 0 % (ref 0.0–0.2)

## 2019-02-17 LAB — URINE CULTURE: Culture: NO GROWTH

## 2019-02-17 LAB — INTERLEUKIN-6, SERUM  IL6SL

## 2019-02-17 MED ORDER — DOXYCYCLINE HYCLATE 100 MG PO TABS: 100.0000 mg | ORAL_TABLET | Freq: Two times a day (BID) | ORAL | 0 refills | Status: AC

## 2019-02-17 MED ORDER — MOMETASONE FURO-FORMOTEROL FUM 100-5 MCG/ACT IN AERO: 2.0000 | INHALATION_SPRAY | Freq: Two times a day (BID) | RESPIRATORY_TRACT | 1 refills | Status: AC

## 2019-02-17 MED ORDER — ALBUTEROL SULFATE HFA 108 (90 BASE) MCG/ACT IN AERS: 1.0000 | INHALATION_SPRAY | Freq: Four times a day (QID) | RESPIRATORY_TRACT | 3 refills | Status: AC | PRN

## 2019-02-17 MED ORDER — NICOTINE 21 MG/24HR TD PT24: 21.0000 mg | MEDICATED_PATCH | Freq: Every day | TRANSDERMAL | 0 refills | Status: DC

## 2019-02-17 MED ORDER — BENZONATATE 100 MG PO CAPS: 100.0000 mg | ORAL_CAPSULE | Freq: Three times a day (TID) | ORAL | 0 refills | Status: DC | PRN

## 2019-02-17 MED ORDER — PANTOPRAZOLE SODIUM 40 MG PO TBEC: 40.0000 mg | DELAYED_RELEASE_TABLET | Freq: Every day | ORAL | 3 refills | Status: AC

## 2019-02-17 MED ORDER — HYDROCOD POLST-CPM POLST ER 10-8 MG/5ML PO SUER: 5.0000 mL | Freq: Two times a day (BID) | ORAL | 0 refills | Status: DC | PRN

## 2019-02-17 MED ORDER — COLCHICINE 0.6 MG PO TABS: 0.6000 mg | ORAL_TABLET | Freq: Every day | ORAL | 0 refills | Status: AC

## 2019-02-17 MED ORDER — INDOMETHACIN 50 MG PO CAPS: 50.0000 mg | ORAL_CAPSULE | Freq: Three times a day (TID) | ORAL | 0 refills | Status: AC

## 2019-02-17 NOTE — Discharge Summary (Signed)
Physician Discharge Summary   Patient ID: Patrick [name] MRN: [id number] DOB/AGE: [id number] 55 y.o.  Admit date: 02/15/2019 Discharge date: 02/17/2019  Primary Care Physician:  Isaac Bliss, Rayford Halsted, MD   Recommendations for Outpatient Follow-up:  1. Follow up with PCP in 1-2 weeks 2. Patient was started on indomethacin 50 mg 3 times daily, which will need to be titrated down after 2 weeks, continue colchicine for at least 3 months. 3. If no improvement in symptoms or patient develops worsening shortness of breath, chest pain, will need referral to cardiology.  4. Annual CTA or MRA for incidental finding of dilatation of an ascending aorta seen on CT angio chest.  Home Health: None Equipment/Devices:   Discharge Condition: stable CODE STATUS: FULL  Diet recommendation: Heart healthy diet   Discharge Diagnoses:    . Acute respiratory failure with hypoxia ([place]), resolved Acute pericarditis COPD exacerbation  . BIPOLAR DISORDER UNSPECIFIED . Essential hypertension . GERD . TOBACCO USER Mild aneurysmal dilatation, ascending aorta  Consults:   Cardiology consult via phone, discussed with Dr. Stanford Breed    Allergies:   Allergies  Allergen Reactions  . Banana Nausea And Vomiting     DISCHARGE MEDICATIONS: Allergies as of 02/17/2019      Reactions   Banana Nausea And Vomiting      Medication List    TAKE these medications   acetaminophen 500 MG tablet Commonly known as: TYLENOL Take 1,500 mg by mouth every 6 (six) hours as needed for mild pain.   albuterol 108 (90 Base) MCG/ACT inhaler Commonly known as: VENTOLIN HFA Inhale 1-2 puffs into the lungs every 6 (six) hours as needed for wheezing or shortness of breath.   ALPRAZolam 0.25 MG tablet Commonly known as: XANAX TAKE 1 TABLET BY MOUTH ONCE DAILY AS NEEDED FOR ANXIETY What changed: See the new instructions.   benzonatate 100 MG capsule Commonly known as: TESSALON Take 1 capsule (100 mg total) by  mouth 3 (three) times daily as needed for cough.   buPROPion 150 MG 24 hr tablet Commonly known as: Wellbutrin XL Take 1 tablet (150 mg total) by mouth daily.   chlorpheniramine-HYDROcodone 10-8 MG/5ML Suer Commonly known as: TUSSIONEX Take 5 mLs by mouth every 12 (twelve) hours as needed for cough.   colchicine 0.6 MG tablet Take 1 tablet (0.6 mg total) by mouth daily.   doxycycline 100 MG tablet Commonly known as: VIBRA-TABS Take 1 tablet (100 mg total) by mouth 2 (two) times daily for 5 days.   DULoxetine 30 MG capsule Commonly known as: Cymbalta Take 1 capsule (30 mg total) by mouth daily.   indomethacin 50 MG capsule Commonly known as: INDOCIN Take 1 capsule (50 mg total) by mouth 3 (three) times daily with meals. Taper per your doctor's instructions   magnesium oxide 400 MG tablet Commonly known as: MAG-OX Take 400 mg by mouth daily as needed (cramps).   mometasone-formoterol 100-5 MCG/ACT Aero Commonly known as: DULERA Inhale 2 puffs into the lungs 2 (two) times daily.   nicotine 21 mg/24hr patch Commonly known as: NICODERM CQ - dosed in mg/24 hours Place 1 patch (21 mg total) onto the skin daily.   ONE-A-DAY MENS PO Take 1 tablet by mouth daily.   pantoprazole 40 MG tablet Commonly known as: PROTONIX Take 1 tablet (40 mg total) by mouth daily before breakfast.   rOPINIRole 4 MG tablet Commonly known as: REQUIP Take 1 tablet (4 mg total) by mouth at bedtime. What changed: Another medication with  the same name was changed. Make sure you understand how and when to take each.   rOPINIRole 1 MG tablet Commonly known as: Requip Take half tab daily as needed What changed:   how much to take  how to take this  when to take this  reasons to take this  additional instructions   UNABLE TO FIND Place 3 tablets under the tongue daily as needed (restless leg). Med Name: Restful leg OTC Hyland's        Brief H and P: For complete details please refer  to admission H and P, but in brief Patient is a 53 year old male with history of tobacco use, restless leg syndrome, anxiety presented to ED with shortness of breath, fevers, loss of taste for the last 5 days. Patient reported that he and his fiance started having similar symptoms, got tested for Covid on 02/12/2019 which was negative. Patient reported fevers of 100-101F daily, has been taking Tylenol for fever persistently for last 2 to 3 days. Patient reported loss of taste. On day of admission, he started having worsening of shortness of breath and chest tightness.  No abdominal pain nausea vomiting or diarrhea In ED, patient was noted to be somewhat wheezing, O2 sats 89% to 92% on room air while ambulating and 95% on room air at rest. COVID-19 test negative Influenza panel negative, respiratory virus panel negative  Hospital Course:   Acute respiratory failure with hypoxia (HCC) likely due to COPD exacerbation -COVID-19 test negative, influenza panel negative, respiratory virus panel negative -Given smoking history, likely has underlying COPD.  CT angiogram chest showed no PE, no pneumonia. -Bilateral lungs coarse breath sounds  with wheezing on exam, patient was started on albuterol inhaler, Dulera, doxycycline.  He was placed on O2 2 L at the time of admission, weaned off -He was strongly recommended to quit smoking.   Abnormal EKG: Likely due to acute pericarditis -Troponins negative, however repeat EKG also shows diffuse ST elevations, suspicion high for acute pericarditis due to fevers, shortness of breath, chest tightness, subacute persistent symptoms -Discussed with Dr. Stanford Breed, agree with the EKG findings and likely viral etiology caused acute pericarditis as inflammatory markers elevated.  Patient denies any history of autoimmune disease, rheumatological ds, rheumatoid arthritis/lupus.  ESR CRP elevated. - will start indomethacin 50 mg 3 times daily, taper over the next 2 to 3  weeks, placed on PPI.  Colchicine for 3 months. - 2D echo showed EF of 55 to 60%, trivial pericardial effusion, no valvular abnormalities. -If worsening symptoms, chest pain, shortness of breath, will need cardiology referral    BIPOLAR DISORDER UNSPECIFIED -Continue Wellbutrin, Cymbalta, Xanax as needed    TOBACCO USER -Counseled on smoking cessation, continue nicotine patch    GERD Continue PPI  Mild aneurysmal dilatation, ascending aorta CT angiogram chest PE protocol showed incidental finding of mild aneurysmal dilatation of ascending aorta 4.3 cm, normal tapering of the thoracic aortic arch and descending thoracic aorta, no dissection.  Recommended annual imaging follow-up by CTA or MRA     Day of Discharge S: Feels better, no chest pain.  Coughing but shortness of breath is improving.  No fevers.  BP (!) 134/97 (BP Location: Right Arm)   Pulse 70   Temp 97.9 F (36.6 C) (Oral)   Resp 17   Ht _0  (1.727 m)   Wt 120.4 kg   SpO2 94%   BMI 40.36 kg/m   Physical Exam: General: Alert and awake oriented x3 not in any  acute distress. HEENT: anicteric sclera, pupils reactive to light and accommodation CVS: S1-S2 clear no murmur rubs or gallops Chest: Much improved from yesterday, faint wheezing Abdomen: soft nontender, nondistended, normal bowel sounds Extremities: no cyanosis, clubbing or edema noted bilaterally Neuro: Cranial nerves II-XII intact, no focal neurological deficits   The results of significant diagnostics from this hospitalization (including imaging, microbiology, ancillary and laboratory) are listed below for reference.      Procedures/Studies:  2D echo 02/16/2019 IMPRESSIONS    1. Technically difficult study. Left ventricular ejection fraction, by visual estimation, is 55 to 60%. The left ventricle has normal function. There is moderately increased left ventricular hypertrophy. Overall preserved systolic function but appears  to be  hypokinesis of basal inferolateral wall.  2. Left ventricular diastolic parameters are indeterminate.  3. Trivial pericardial effusion is present.  4. Global right ventricle has normal systolic function.The right ventricular size is normal. No increase in right ventricular wall thickness.  5. There is dilatation of the aortic root measuring 42 mm. Ascending aorta is dilated measuring 41 mm.  6. The mitral valve is normal in structure. No evidence of mitral valve regurgitation.  7. The aortic valve is tricuspid. Aortic valve regurgitation is not visualized. No evidence of aortic valve sclerosis or stenosis.  8. The pulmonic valve was not well visualized. Pulmonic valve regurgitation is not visualized.  9. The tricuspid valve is not well visualized. Tricuspid valve regurgitation is not demonstrated. 10. Left atrial size was normal. 11. Right atrial size was normal. 12. The inferior vena cava is dilated in size with >50% respiratory variability, suggesting right atrial pressure of 8 mmHg.   Ct Angio Chest Pe W Or Wo Contrast  Result Date: 02/16/2019 CLINICAL DATA:  Shortness of breath with fever and cough EXAM: CT ANGIOGRAPHY CHEST WITH CONTRAST TECHNIQUE: Multidetector CT imaging of the chest was performed using the standard protocol during bolus administration of intravenous contrast. Multiplanar CT image reconstructions and MIPs were obtained to evaluate the vascular anatomy. CONTRAST:  171m OMNIPAQUE IOHEXOL 350 MG/ML SOLN COMPARISON:  03/26/2006, 02/15/2019 FINDINGS: Cardiovascular: Ascending aorta demonstrates mild aneurysmal dilatation to 4.3 cm. Normal tapering in the thoracic aortic arch is seen. The descending thoracic aorta is within limits. No dissection seen. Heart is not significantly enlarged. Coronary calcifications are noted. Pulmonary artery is normal limits. No focal filling defects are identified to suggest pulmonary embolism. Mediastinum/Nodes: Esophagus as visualized is within  normal limits. No hilar or mediastinal adenopathy is noted. Thoracic inlet is within normal limits. Lungs/Pleura: Lungs are well aerated bilaterally. No focal infiltrate or sizable effusion is seen. Upper Abdomen: Visualized upper abdomen demonstrates cystic change of the right kidney stable from prior CT from 2007. Musculoskeletal: Degenerative changes of the thoracic spine are noted. Review of the MIP images confirms the above findings. IMPRESSION: No evidence of pulmonary emboli. 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 Recommend annual imaging followup by CTA or MRA. This recommendation follows 2010 ACCF/AHA/AATS/ACR/ASA/SCA/SCAI/SIR/STS/SVM Guidelines for the Diagnosis and Management of Patients with Thoracic Aortic Disease. Circulation. 2010; 121:: E703-J009 Aortic aneurysm NOS (ICD10-I71.9) No other focal abnormality is noted. Electronically Signed   By: MInez CatalinaM.D.   On: 02/16/2019 01:22   Dg Chest Port 1 View  Result Date: 02/15/2019 CLINICAL DATA:  Shortness of breath, fever, cough EXAM: PORTABLE CHEST 1 VIEW COMPARISON:  08/27/2016 FINDINGS: Cardiomegaly. Unchanged elevation of the left hemidiaphragm. The visualized skeletal structures are unremarkable. IMPRESSION: Cardiomegaly. Unchanged elevation of the left hemidiaphragm without acute appearing airspace  opacity. Electronically Signed   By: Eddie Candle M.D.   On: 02/15/2019 16:24       LAB RESULTS: Basic Metabolic Panel: Recent Labs  Lab 02/16/19 0324 02/17/19 0332  NA 137 138  K 4.3 3.9  CL 102 102  CO2 27 27  GLUCOSE 155* 111*  BUN 17 24*  CREATININE 0.79 0.70  CALCIUM 9.0 8.8*   Liver Function Tests: Recent Labs  Lab 02/15/19 1450  AST 14*  ALT 15  ALKPHOS 56  BILITOT 1.1  PROT 7.2  ALBUMIN 3.4*   No results for input(s): LIPASE, AMYLASE in the last 168 hours. No results for input(s): AMMONIA in the last 168 hours. CBC: Recent Labs  Lab 02/15/19 1450 02/16/19 0324 02/17/19 0332  WBC 11.2* 8.7  14.0*  NEUTROABS 7.6  --   --   HGB 13.1 13.3 12.3*  HCT 41.2 43.9 39.0  MCV 92.6 97.8 92.9  PLT 227 234 224   Cardiac Enzymes: No results for input(s): CKTOTAL, CKMB, CKMBINDEX, TROPONINI in the last 168 hours. BNP: Invalid input(s): POCBNP CBG: No results for input(s): GLUCAP in the last 168 hours.    Disposition and Follow-up: Discharge Instructions    Diet - low sodium heart healthy   Complete by: As directed    Increase activity slowly   Complete by: As directed        DISPOSITION: [place]    Isaac Bliss, Rayford Halsted, MD. Schedule an appointment as soon as possible for a visit in 2 week(s).   Specialty: Internal Medicine Contact information: [place] [phone]            Time coordinating discharge:  35 mins   Signed:   Estill Cotta M.D. [facility] 02/17/2019, 11:39 AM

## 2019-02-17 NOTE — Telephone Encounter (Signed)
Patient at ER  

## 2019-02-20 LAB — CULTURE, BLOOD (ROUTINE X 2)
Culture: NO GROWTH
Culture: NO GROWTH
Special Requests: ADEQUATE

## 2019-02-22 ENCOUNTER — Other Ambulatory Visit: Payer: Self-pay | Admitting: Internal Medicine

## 2019-02-22 ENCOUNTER — Encounter: Payer: Self-pay | Admitting: Internal Medicine

## 2019-02-22 DIAGNOSIS — G2581 Restless legs syndrome: Secondary | ICD-10-CM

## 2019-02-25 MED ORDER — ROPINIROLE HCL 1 MG PO TABS: ORAL_TABLET | ORAL | 0 refills | Status: DC

## 2019-03-11 ENCOUNTER — Encounter: Payer: Self-pay | Admitting: Internal Medicine

## 2019-03-13 ENCOUNTER — Other Ambulatory Visit: Payer: Self-pay | Admitting: Internal Medicine

## 2019-03-13 DIAGNOSIS — F419 Anxiety disorder, unspecified: Secondary | ICD-10-CM

## 2019-03-13 DIAGNOSIS — G2581 Restless legs syndrome: Secondary | ICD-10-CM

## 2019-03-13 NOTE — Telephone Encounter (Signed)
Medication Refill - Medication:  Requip 1mg  xanax generic 0.25  Has the patient contacted their pharmacy? No. (Agent: If no, request that the patient contact the pharmacy for the refill.) (Agent: If yes, when and what did the pharmacy advise?)  Preferred Pharmacy (with phone number or street name): [facility] [address]  Agent: Please be advised that RX refills may take up to 3 business days. We ask that you follow-up with your pharmacy.

## 2019-03-17 MED ORDER — ALPRAZOLAM 0.25 MG PO TABS: ORAL_TABLET | ORAL | 2 refills | Status: AC

## 2019-03-17 NOTE — Telephone Encounter (Signed)
Requip last filled 02/25/2019  Xanax last filled 11/13/2018  Ok to fill?

## 2019-03-18 ENCOUNTER — Telehealth (INDEPENDENT_AMBULATORY_CARE_PROVIDER_SITE_OTHER): Payer: BLUE CROSS/BLUE SHIELD | Admitting: Internal Medicine

## 2019-03-18 ENCOUNTER — Other Ambulatory Visit: Payer: Self-pay

## 2019-03-18 DIAGNOSIS — M25551 Pain in right hip: Secondary | ICD-10-CM | POA: Diagnosis not present

## 2019-03-18 DIAGNOSIS — G2581 Restless legs syndrome: Secondary | ICD-10-CM | POA: Diagnosis not present

## 2019-03-18 MED ORDER — MELOXICAM 15 MG PO TABS: 15.0000 mg | ORAL_TABLET | Freq: Every day | ORAL | 1 refills | Status: AC

## 2019-03-18 MED ORDER — ROPINIROLE HCL 1 MG PO TABS: 2.0000 mg | ORAL_TABLET | Freq: Every day | ORAL | 1 refills | Status: AC

## 2019-03-18 MED ORDER — ROPINIROLE HCL 4 MG PO TABS: 8.0000 mg | ORAL_TABLET | Freq: Every day | ORAL | 1 refills | Status: DC

## 2019-03-18 NOTE — Progress Notes (Signed)
Virtual Visit via Video Note  I connected with Patrick [name] on 03/18/19 at  3:00 PM EST by a video enabled telemedicine application and verified that I am speaking with the correct person using two identifiers.  Location patient: home Location provider: work office Persons participating in the virtual visit: patient, provider  I discussed the limitations of evaluation and management by telemedicine and the availability of in person appointments. The patient expressed understanding and agreed to proceed.   HPI: He has scheduled this next visit for 3 main reasons:  1.  He would like to have Covid antibody testing.  He currently has no symptoms of the disease.  He was hospitalized in early November due to respiratory failure thought due to COPD with exacerbation.  His Covid test was negative, he was also negative for pneumonia.  All symptoms have resolved.  2.  He is having significant right hip pain.  He has had his left hip replaced.  He would like to see about a prescription for meloxicam.  His significant other uses it and has provided good relief.  3.  He has a diagnosis of what we presume is restless leg versus neuropathy.  He is prescribed 4 mg of ropinirole at bedtime with an additional 1 mg prescribed for him to take half a tablet that he can take throughout the day if needed.  His symptoms have become so bad that he has been doubling up on both the daytime and the evening doses and he has now run out and needs refills.  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  He has tried Neurontin and Lyrica in the past without success.   ROS: Constitutional: Denies fever, chills, diaphoresis, appetite change and fatigue.  HEENT: Denies photophobia, eye pain, redness, hearing loss, ear pain, congestion, sore throat, rhinorrhea, sneezing, mouth sores, trouble swallowing, neck pain, neck stiffness and tinnitus.   Respiratory: Denies SOB, DOE,  cough, chest tightness,  and wheezing.   Cardiovascular: Denies chest pain, palpitations and leg swelling.  Gastrointestinal: Denies nausea, vomiting, abdominal pain, diarrhea, constipation, blood in stool and abdominal distention.  Genitourinary: Denies dysuria, urgency, frequency, hematuria, flank pain and difficulty urinating.  Endocrine: Denies: hot or cold intolerance, sweats, changes in hair or nails, polyuria, polydipsia. Musculoskeletal: Denies myalgias, back pain, joint swelling, arthralgias and gait problem.  Skin: Denies pallor, rash and wound.  Neurological: Denies dizziness, seizures, syncope, weakness, light-headedness, numbness and headaches.  Hematological: Denies adenopathy. Easy bruising, personal or family bleeding history  Psychiatric/Behavioral: Denies suicidal ideation, mood changes, confusion, nervousness, sleep disturbance and agitation   Past Medical History:  Diagnosis Date  . Arthritis   . Blood transfusion without reported diagnosis 1984   20+ units  . Renal disorder   . RLS (restless legs syndrome)   . Tobacco use     Past Surgical History:  Procedure Laterality Date  . arm surgery  1984   MVI  . CARDIAC SURGERY  1984   MVI  . CHOLECYSTECTOMY  ?2007  . JOINT REPLACEMENT    . KIDNEY SURGERY  1984   MVI  . MANDIBLE FRACTURE SURGERY  1984   MVI  . TOTAL HIP ARTHROPLASTY Left 09/07/2016   Procedure: TOTAL HIP ARTHROPLASTY;  Surgeon: Earlie Server, MD;  Location: [place];  Service: Orthopedics;  Laterality: Left;    Family History  Problem Relation Age of Onset  . Leukemia Father 78  Due to agent orange, deceased  . Hearing loss Mother        Living  . Diabetes Brother   . Obesity Brother   . Healthy Brother   . Alcohol abuse Sister   . Colon cancer Neg Hx   . Esophageal cancer Neg Hx   . Stomach cancer Neg Hx   . Rectal cancer Neg Hx   . Colon polyps Neg Hx     SOCIAL HX:   reports that he has been smoking cigarettes. He has a 26.25  pack-year smoking history. He has never used smokeless tobacco. He reports current alcohol use of about 2.0 standard drinks of alcohol per week. He reports that he does not use drugs.   Current Outpatient Medications:  .  acetaminophen (TYLENOL) 500 MG tablet, Take 1,500 mg by mouth every 6 (six) hours as needed for mild pain., Disp: , Rfl:  .  albuterol (VENTOLIN HFA) 108 (90 Base) MCG/ACT inhaler, Inhale 1-2 puffs into the lungs every 6 (six) hours as needed for wheezing or shortness of breath., Disp: 16 g, Rfl: 3 .  ALPRAZolam (XANAX) 0.25 MG tablet, TAKE 1 TABLET BY MOUTH ONCE DAILY AS NEEDED FOR ANXIETY, Disp: 15 tablet, Rfl: 2 .  benzonatate (TESSALON) 100 MG capsule, Take 1 capsule (100 mg total) by mouth 3 (three) times daily as needed for cough., Disp: 30 capsule, Rfl: 0 .  buPROPion (WELLBUTRIN XL) 150 MG 24 hr tablet, Take 1 tablet (150 mg total) by mouth daily., Disp: 90 tablet, Rfl: 1 .  chlorpheniramine-HYDROcodone (TUSSIONEX) 10-8 MG/5ML SUER, Take 5 mLs by mouth every 12 (twelve) hours as needed for cough., Disp: 115 mL, Rfl: 0 .  colchicine 0.6 MG tablet, Take 1 tablet (0.6 mg total) by mouth daily., Disp: 90 tablet, Rfl: 0 .  DULoxetine (CYMBALTA) 30 MG capsule, Take 1 capsule (30 mg total) by mouth daily., Disp: 90 capsule, Rfl: 0 .  indomethacin (INDOCIN) 50 MG capsule, Take 1 capsule (50 mg total) by mouth 3 (three) times daily with meals. Taper per your doctor's instructions, Disp: 90 capsule, Rfl: 0 .  magnesium oxide (MAG-OX) 400 MG tablet, Take 400 mg by mouth daily as needed (cramps). , Disp: , Rfl:  .  meloxicam (MOBIC) 15 MG tablet, Take 1 tablet (15 mg total) by mouth daily., Disp: 30 tablet, Rfl: 1 .  mometasone-formoterol (DULERA) 100-5 MCG/ACT AERO, Inhale 2 puffs into the lungs 2 (two) times daily., Disp: 13 g, Rfl: 1 .  Multiple Vitamin (ONE-A-DAY MENS PO), Take 1 tablet by mouth daily. , Disp: , Rfl:  .  nicotine (NICODERM CQ - DOSED IN MG/24 HOURS) 21 mg/24hr  patch, Place 1 patch (21 mg total) onto the skin daily., Disp: 28 patch, Rfl: 0 .  pantoprazole (PROTONIX) 40 MG tablet, Take 1 tablet (40 mg total) by mouth daily before breakfast., Disp: 30 tablet, Rfl: 3 .  rOPINIRole (REQUIP) 1 MG tablet, Take 2 tablets (2 mg total) by mouth daily. Take half tab daily as needed, Disp: 60 tablet, Rfl: 1 .  rOPINIRole (REQUIP) 4 MG tablet, Take 2 tablets (8 mg total) by mouth at bedtime., Disp: 60 tablet, Rfl: 1 .  UNABLE TO FIND, Place 3 tablets under the tongue daily as needed (restless leg). Med Name: Restful leg OTC Hyland's , Disp: , Rfl:   EXAM:   VITALS per patient if applicable: None reported  GENERAL: alert, oriented, appears well and in no acute distress  HEENT: atraumatic, conjunttiva clear, no obvious  abnormalities on inspection of external nose and ears, wears corrective lenses  NECK: normal movements of the head and neck  LUNGS: on inspection no signs of respiratory distress, breathing rate appears normal, no obvious gross increased work of breathing, gasping or wheezing  CV: no obvious cyanosis  MS: moves all visible extremities without noticeable abnormality  PSYCH/NEURO: pleasant and cooperative, no obvious depression or anxiety, speech and thought processing grossly intact  ASSESSMENT AND PLAN:   RESTLESS LEG SYNDROME  -I feel uncomfortable with the degree of ropinirole that he is on right now, I also would feel uncomfortable prescribing Sinemet for this reason only. -Per report he has tried gabapentin and Lyrica without relief. -I feel at this point referral to neurology is necessary to further assist in managing restless leg syndrome. -He has asked for me to renew his prescription of 4 mg of ropinirole for him to take 2 tablets at bedtime and 1 mg of ropinirole for him to take 2 tablets during the day.  I will give him enough for 1 month.  I have advised him that further prescriptions will be dependent on neurology's opinion on  best management for restless leg.  Right hip pain  -Have agreed to prescribe meloxicam. -He has also been encouraged to follow-up with orthopedics, steroid injections might prove beneficial.  He insists on having Covid antibody testing, despite counseling on my end that we are still not quite sure that these antibodies have a protective nature against further contracting the disease.  Will order.  He has no current Covid symptoms.     I discussed the assessment and treatment plan with the patient. The patient was provided an opportunity to ask questions and all were answered. The patient agreed with the plan and demonstrated an understanding of the instructions.   The patient was advised to call back or seek an in-person evaluation if the symptoms worsen or if the condition fails to improve as anticipated.    Lelon Frohlich, MD  [facility]

## 2019-03-23 ENCOUNTER — Telehealth: Payer: Self-pay | Admitting: *Deleted

## 2019-03-23 NOTE — Telephone Encounter (Signed)
Copied from [place] [id number]. Topic: Referral - Request for Referral >> Mar 23, 2019  3:59 PM Rainey Pines A wrote: Has patient seen PCP for this complaint? Yes  *If NO, is insurance requiring patient see PCP for this issue before PCP can refer them? Referral for which specialty: Neurology  Preferred provider/office: No preference ; Doesn't want Dr .Posey Pronto (male)

## 2019-03-23 NOTE — Telephone Encounter (Signed)
Please see message.  Please advise. 

## 2019-03-23 NOTE — Telephone Encounter (Signed)
Copied from [place] [id number]. Topic: General - Other >> Mar 23, 2019  4:03 PM Rainey Pines A wrote: Patient requesting callback in regards to Covid 19 antibody testing and blood work to be completed. Lavella Lemons stated that patient still hasnt heard from office in regards to scheduling this. Please advise

## 2019-03-24 NOTE — Telephone Encounter (Signed)
Called patient and LMOVM to return call  [place] for [facility] to Discuss results / PCP / recommendations / Schedule patient  Patient needs to have a lab appointment scheduled for covid antibody test per Dr. Jerilee Hoh. Lab has been ordered.  CRM Created.

## 2019-03-24 NOTE — Telephone Encounter (Signed)
Called patient and LMOVM to return call  Patrick [name] for [facility] to Discuss results / PCP / recommendations / Schedule patient  I see the Neurology referral has been ordered and sent to the [facility] Neurology work que and patient can call them at [phone] to schedule an appointment if he does not hear from them. They should call him with an appointment but he can call them if he needs to.  CRM Created.

## 2019-04-01 ENCOUNTER — Encounter: Payer: Self-pay | Admitting: Internal Medicine

## 2019-04-02 ENCOUNTER — Encounter: Payer: Self-pay | Admitting: Internal Medicine

## 2019-04-02 ENCOUNTER — Ambulatory Visit: Payer: BLUE CROSS/BLUE SHIELD | Admitting: Internal Medicine

## 2019-04-02 NOTE — Telephone Encounter (Signed)
Spoke with patient and he will send a message with the name of a neurologist that he wants to be referred to.

## 2019-04-02 NOTE — Telephone Encounter (Signed)
Patient is aware and will go for testing.  Letter will be faxed per patient request.

## 2019-04-06 ENCOUNTER — Encounter: Payer: Self-pay | Admitting: Internal Medicine

## 2019-04-07 ENCOUNTER — Ambulatory Visit: Payer: BLUE CROSS/BLUE SHIELD | Attending: Internal Medicine

## 2019-04-07 DIAGNOSIS — Z20822 Contact with and (suspected) exposure to covid-19: Secondary | ICD-10-CM

## 2019-04-08 LAB — NOVEL CORONAVIRUS, NAA: SARS-CoV-2, NAA: NOT DETECTED

## 2019-04-09 ENCOUNTER — Encounter: Payer: Self-pay | Admitting: Vascular Surgery

## 2019-04-09 ENCOUNTER — Other Ambulatory Visit: Payer: Self-pay | Admitting: Internal Medicine

## 2019-04-09 DIAGNOSIS — F322 Major depressive disorder, single episode, severe without psychotic features: Secondary | ICD-10-CM

## 2019-04-12 ENCOUNTER — Encounter: Payer: Self-pay | Admitting: Internal Medicine

## 2019-04-13 ENCOUNTER — Telehealth: Payer: Self-pay | Admitting: Internal Medicine

## 2019-04-13 ENCOUNTER — Ambulatory Visit: Payer: Self-pay | Admitting: *Deleted

## 2019-04-13 NOTE — Telephone Encounter (Signed)
Copied from [place] [phone]. Topic: Referral - Request for Referral >> Apr 13, 2019  3:58 PM Alanda Slim E wrote: Has patient seen PCP for this complaint? Yes  *If NO, is insurance requiring patient see PCP for this issue before PCP can refer them? Referral for which specialty: Neurologist  Preferred provider/office: Dr. Wells Guiles [facility]  Reason for referral: Pt needs a new referral for a new Neurologist / Pt seen Dr. Posey Pronto for her RLS and he was not pleased with this provider and he would a new referral for the Provider above

## 2019-04-13 NOTE — Telephone Encounter (Signed)
Patient is calling to report he had has negative COVID testing. He is experiencing dizziness when he changes position or moves head. Patient is unable to check BP/P for me while on the triage call. Call to office for appointment.  Reason for Disposition . [1] MODERATE dizziness (e.g., interferes with normal activities) AND [2] has NOT been evaluated by physician for this  (Exception: dizziness caused by heat exposure, sudden standing, or poor fluid intake)  Answer Assessment - Initial Assessment Questions 1. DESCRIPTION: "Describe your dizziness."     "swimmy headed'  Changing positions causes 2. LIGHTHEADED: "Do you feel lightheaded?" (e.g., somewhat faint, woozy, weak upon standing)     Sometimes if moves too quick 3. VERTIGO: "Do you feel like either you or the room is spinning or tilting?" (i.e. vertigo)     Yes- at times 4. SEVERITY: "How bad is it?"  "Do you feel like you are going to faint?" "Can you stand and walk?"   - MILD - walking normally   - MODERATE - interferes with normal activities (e.g., work, school)    - SEVERE - unable to stand, requires support to walk, feels like passing out now.      moderate 5. ONSET:  "When did the dizziness begin?"     2 weeks 6. AGGRAVATING FACTORS: "Does anything make it worse?" (e.g., standing, change in head position)     Changing position- moving head 7. HEART RATE: "Can you tell me your heart rate?" "How many beats in 15 seconds?"  (Note: not all patients can do this)       unable 8. CAUSE: "What do you think is causing the dizziness?"     Never had dizziness- unsure 9. RECURRENT SYMPTOM: "Have you had dizziness before?" If so, ask: "When was the last time?" "What happened that time?"     never 10. OTHER SYMPTOMS: "Do you have any other symptoms?" (e.g., fever, chest pain, vomiting, diarrhea, bleeding)       no 11. PREGNANCY: "Is there any chance you are pregnant?" "When was your last menstrual period?"       n/a  Protocols used:  DIZZINESS [facility]

## 2019-04-13 NOTE — Telephone Encounter (Signed)
The patient is having dizziness and has had a COVID test that is negative. He is needing to get clearance from his provider to be able to go back to work.  I have him scheduled for a virtual on Wednesday he is wanting to see if he can be seen tomorrow if possible.   Please advise

## 2019-04-15 ENCOUNTER — Other Ambulatory Visit: Payer: Self-pay

## 2019-04-15 ENCOUNTER — Encounter: Payer: Self-pay | Admitting: Internal Medicine

## 2019-04-15 ENCOUNTER — Ambulatory Visit: Payer: BLUE CROSS/BLUE SHIELD | Admitting: Internal Medicine

## 2019-04-15 VITALS — Temp 97.1°F | Wt 276.1 lb

## 2019-04-15 DIAGNOSIS — F322 Major depressive disorder, single episode, severe without psychotic features: Secondary | ICD-10-CM | POA: Diagnosis not present

## 2019-04-15 DIAGNOSIS — J9601 Acute respiratory failure with hypoxia: Secondary | ICD-10-CM

## 2019-04-15 DIAGNOSIS — H8111 Benign paroxysmal vertigo, right ear: Secondary | ICD-10-CM

## 2019-04-15 MED ORDER — DULOXETINE HCL 30 MG PO CPEP: 30.0000 mg | ORAL_CAPSULE | Freq: Every day | ORAL | 1 refills | Status: AC

## 2019-04-15 NOTE — Patient Instructions (Signed)
-Nice seeing you today!!   Benign Positional Vertigo Vertigo is the feeling that you or your surroundings are moving when they are not. Benign positional vertigo is the most common form of vertigo. This is usually a harmless condition (benign). This condition is positional. This means that symptoms are triggered by certain movements and positions. This condition can be dangerous if it occurs while you are doing something that could cause harm to you or others. This includes activities such as driving or operating machinery. What are the causes? In many cases, the cause of this condition is not known. It may be caused by a disturbance in an area of the inner ear that helps your brain to sense movement and balance. This disturbance can be caused by:  Viral infection (labyrinthitis).  Head injury.  Repetitive motion, such as jumping, dancing, or running. What increases the risk? You are more likely to develop this condition if:  You are a woman.  You are 56 years of age or older. What are the signs or symptoms? Symptoms of this condition usually happen when you move your head or your eyes in different directions. Symptoms may start suddenly, and usually last for less than a minute. They include:  Loss of balance and falling.  Feeling like you are spinning or moving.  Feeling like your surroundings are spinning or moving.  Nausea and vomiting.  Blurred vision.  Dizziness.  Involuntary eye movement (nystagmus). Symptoms can be mild and cause only minor problems, or they can be severe and interfere with daily life. Episodes of benign positional vertigo may return (recur) over time. Symptoms may improve over time. How is this diagnosed? This condition may be diagnosed based on:  Your medical history.  Physical exam of the head, neck, and ears.  Tests, such as: ? MRI. ? CT scan. ? Eye movement tests. Your health care provider may ask you to change positions quickly while he or  she watches you for symptoms of benign positional vertigo, such as nystagmus. Eye movement may be tested with a variety of exams that are designed to evaluate or stimulate vertigo. ? An electroencephalogram (EEG). This records electrical activity in your brain. ? Hearing tests. You may be referred to a health care provider who specializes in ear, nose, and throat (ENT) problems (otolaryngologist) or a provider who specializes in disorders of the nervous system (neurologist). How is this treated?  This condition may be treated in a session in which your health care provider moves your head in specific positions to adjust your inner ear back to normal. Treatment for this condition may take several sessions. Surgery may be needed in severe cases, but this is rare. In some cases, benign positional vertigo may resolve on its own in 2-4 weeks. Follow these instructions at home: Safety  Move slowly. Avoid sudden body or head movements or certain positions, as told by your health care provider.  Avoid driving until your health care provider says it is safe for you to do so.  Avoid operating heavy machinery until your health care provider says it is safe for you to do so.  Avoid doing any tasks that would be dangerous to you or others if vertigo occurs.  If you have trouble walking or keeping your balance, try using a cane for stability. If you feel dizzy or unstable, sit down right away.  Return to your normal activities as told by your health care provider. Ask your health care provider what activities are safe for  you. General instructions  Take over-the-counter and prescription medicines only as told by your health care provider.  Drink enough fluid to keep your urine pale yellow.  Keep all follow-up visits as told by your health care provider. This is important. Contact a health care provider if:  You have a fever.  Your condition gets worse or you develop new symptoms.  Your family  or friends notice any behavioral changes.  You have nausea or vomiting that gets worse.  You have numbness or a "pins and needles" sensation. Get help right away if you:  Have difficulty speaking or moving.  Are always dizzy.  Faint.  Develop severe headaches.  Have weakness in your legs or arms.  Have changes in your hearing or vision.  Develop a stiff neck.  Develop sensitivity to light. Summary  Vertigo is the feeling that you or your surroundings are moving when they are not. Benign positional vertigo is the most common form of vertigo.  The cause of this condition is not known. It may be caused by a disturbance in an area of the inner ear that helps your brain to sense movement and balance.  Symptoms include loss of balance and falling, feeling that you or your surroundings are moving, nausea and vomiting, and blurred vision.  This condition can be diagnosed based on symptoms, physical exam, and other tests, such as MRI, CT scan, eye movement tests, and hearing tests.  Follow safety instructions as told by your health care provider. You will also be told when to contact your health care provider in case of problems. This information is not intended to replace advice given to you by your health care provider. Make sure you discuss any questions you have with your health care provider.  Document Revised: 09/04/2017 Document Reviewed: 09/04/2017 Elsevier Patient Education  [employer].  How to Perform the Epley Maneuver The Epley maneuver is an exercise that relieves symptoms of vertigo. Vertigo is the feeling that you or your surroundings are moving when they are not. When you feel vertigo, you may feel like the room is spinning and have trouble walking. Dizziness is a little different than vertigo. When you are dizzy, you may feel unsteady or light-headed. You can do this maneuver at home whenever you have symptoms of vertigo. You can do it up to 3 times a day until  your symptoms go away. Even though the Epley maneuver may relieve your vertigo for a few weeks, it is possible that your symptoms will return. This maneuver relieves vertigo, but it does not relieve dizziness. What are the risks? If it is done correctly, the Epley maneuver is considered safe. Sometimes it can lead to dizziness or nausea that goes away after a short time. If you develop other symptoms, such as changes in vision, weakness, or numbness, stop doing the maneuver and call your health care provider. How to perform the Epley maneuver 1. Sit on the edge of a bed or table with your back straight and your legs extended or hanging over the edge of the bed or table. 2. Turn your head halfway toward the affected ear or side. 3. Lie backward quickly with your head turned until you are lying flat on your back. You may want to position a pillow under your shoulders. 4. Hold this position for 30 seconds. You may experience an attack of vertigo. This is normal. 5. Turn your head to the opposite direction until your unaffected ear is facing the floor. 6.  Hold this position for 30 seconds. You may experience an attack of vertigo. This is normal. Hold this position until the vertigo stops. 7. Turn your whole body to the same side as your head. Hold for another 30 seconds. 8. Sit back up. You can repeat this exercise up to 3 times a day. Follow these instructions at home:  After doing the Epley maneuver, you can return to your normal activities.  Ask your health care provider if there is anything you should do at home to prevent vertigo. He or she may recommend that you: ? Keep your head raised (elevated) with two or more pillows while you sleep. ? Do not sleep on the side of your affected ear. ? Get up slowly from bed. ? Avoid sudden movements during the day. ? Avoid extreme head movement, like looking up or bending over. Contact a health care provider if:  Your vertigo gets worse.  You have  other symptoms, including: ? Nausea. ? Vomiting. ? Headache. Get help right away if:  You have vision changes.  You have a severe or worsening headache or neck pain.  You cannot stop vomiting.  You have new numbness or weakness in any part of your body. Summary  Vertigo is the feeling that you or your surroundings are moving when they are not.  The Epley maneuver is an exercise that relieves symptoms of vertigo.  If the Epley maneuver is done correctly, it is considered safe. You can do it up to 3 times a day. This information is not intended to replace advice given to you by your health care provider. Make sure you discuss any questions you have with your health care provider. Document Revised: 03/08/2017 Document Reviewed: 02/14/2016 Elsevier Patient Education  2020 [employer].

## 2019-04-15 NOTE — Progress Notes (Signed)
Established Patient Office Visit     This visit occurred during the SARS-CoV-2 public health emergency.  Safety protocols were in place, including screening questions prior to the visit, additional usage of staff PPE, and extensive cleaning of exam room while observing appropriate contact time as indicated for disinfecting solutions.    CC/Reason for Visit: Dizziness, wants covid antibody test, medication refills.  HPI: Patrick [name] is a 54 y.o. male who is coming in today for the above mentioned reasons. Here today for several acute concerns:  1. Wants his cymbalta refilled.  2. Would like COVID antibody testing. Had an acute hypoxemic respiratory illness in November with negative COVID test and he is "sure" he had COVID and wants this test done today.  3. Severe dizziness and nausea for about 3 days, especially worse when turning head to the right. His balance is off, he has to hold onto walls when he walks. Feels like his eyes are "jumpy".   Past Medical/Surgical History: Past Medical History:  Diagnosis Date  . Arthritis   . Blood transfusion without reported diagnosis 1984   20+ units  . Renal disorder   . RLS (restless legs syndrome)   . Tobacco use     Past Surgical History:  Procedure Laterality Date  . arm surgery  1984   MVI  . CARDIAC SURGERY  1984   MVI  . CHOLECYSTECTOMY  ?2007  . JOINT REPLACEMENT    . KIDNEY SURGERY  1984   MVI  . MANDIBLE FRACTURE SURGERY  1984   MVI  . 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 He has a 26.25 pack-year smoking history. He has never used smokeless tobacco. He reports current alcohol use of about 2.0 standard drinks of alcohol per week. He reports that he does not use drugs.  Allergies: Allergies  Allergen Reactions  . Banana  Nausea And Vomiting    Family History:  Family History  Problem Relation Age of Onset  . Leukemia Father 64       Due to agent orange, deceased  . Hearing loss Mother        Living  . Diabetes Brother   . Obesity Brother   . Healthy Brother   . Alcohol abuse Sister   . Colon cancer Neg Hx   . Esophageal cancer Neg Hx   . Stomach cancer Neg Hx   . Rectal cancer Neg Hx   . Colon polyps Neg Hx      Current Outpatient Medications:  .  acetaminophen (TYLENOL) 500 MG tablet, Take 1,500 mg by mouth every 6 (six) hours as needed for mild pain., Disp: , Rfl:  .  albuterol (VENTOLIN HFA) 108 (90 Base) MCG/ACT inhaler, Inhale 1-2 puffs into the lungs every 6 (six) hours as needed for wheezing or shortness of breath., Disp: 16 g, Rfl: 3 .  ALPRAZolam (XANAX) 0.25 MG tablet, TAKE 1 TABLET BY MOUTH ONCE DAILY AS NEEDED FOR ANXIETY, Disp: 15 tablet, Rfl: 2 .  buPROPion (WELLBUTRIN XL) 150 MG 24 hr tablet, Take 1 tablet (150 mg total) by mouth daily., Disp: 90 tablet, Rfl: 1 .  colchicine 0.6 MG tablet, Take 1 tablet (0.6 mg total) by mouth daily., Disp: 90 tablet, Rfl: 0 .  DULoxetine (CYMBALTA) 30 MG capsule, Take 1  capsule (30 mg total) by mouth daily., Disp: 90 capsule, Rfl: 1 .  indomethacin (INDOCIN) 50 MG capsule, Take 1 capsule (50 mg total) by mouth 3 (three) times daily with meals. Taper per your doctor's instructions, Disp: 90 capsule, Rfl: 0 .  magnesium oxide (MAG-OX) 400 MG tablet, Take 400 mg by mouth daily as needed (cramps). , Disp: , Rfl:  .  meloxicam (MOBIC) 15 MG tablet, Take 1 tablet (15 mg total) by mouth daily., Disp: 30 tablet, Rfl: 1 .  mometasone-formoterol (DULERA) 100-5 MCG/ACT AERO, Inhale 2 puffs into the lungs 2 (two) times daily., Disp: 13 g, Rfl: 1 .  Multiple Vitamin (ONE-A-DAY MENS PO), Take 1 tablet by mouth daily. , Disp: , Rfl:  .  pantoprazole (PROTONIX) 40 MG tablet, Take 1 tablet (40 mg total) by mouth daily before breakfast., Disp: 30 tablet, Rfl: 3 .   rOPINIRole (REQUIP) 1 MG tablet, Take 2 tablets (2 mg total) by mouth daily. Take half tab daily as needed, Disp: 60 tablet, Rfl: 1 .  rOPINIRole (REQUIP) 4 MG tablet, Take 2 tablets (8 mg total) by mouth at bedtime., Disp: 60 tablet, Rfl: 1 .  UNABLE TO FIND, Place 3 tablets under the tongue daily as needed (restless leg). Med Name: Restful leg OTC Hyland's , Disp: , Rfl:   Review of Systems:  Constitutional: Denies fever, chills, diaphoresis, appetite change and fatigue.  HEENT: Denies photophobia, eye pain, redness, hearing loss, ear pain, congestion, sore throat, rhinorrhea, sneezing, mouth sores, trouble swallowing, neck pain, neck stiffness and tinnitus.   Respiratory: Denies SOB, DOE, cough, chest tightness,  and wheezing.   Cardiovascular: Denies chest pain, palpitations and leg swelling.  Gastrointestinal: Denies vomiting, abdominal pain, diarrhea, constipation, blood in stool and abdominal distention.  Genitourinary: Denies dysuria, urgency, frequency, hematuria, flank pain and difficulty urinating.  Endocrine: Denies: hot or cold intolerance, sweats, changes in hair or nails, polyuria, polydipsia. Musculoskeletal: Denies myalgias, back pain, joint swelling, arthralgias and gait problem.  Skin: Denies pallor, rash and wound.  Neurological: Denies  seizures, syncope, weakness,  numbness and headaches.  Hematological: Denies adenopathy. Easy bruising, personal or family bleeding history  Psychiatric/Behavioral: Denies suicidal ideation, mood changes, confusion, nervousness, sleep disturbance and agitation    Physical Exam: Vitals:   04/15/19 1436 04/15/19 1439 04/15/19 1441  Temp: (!) 97.1 F (36.2 C)    TempSrc: Temporal    SpO2: 97% 97% 97%  Weight: 276 lb 1.6 oz (125.2 kg)      Body mass index is 41.98 kg/m.   Constitutional: NAD, calm, comfortable Eyes: PERRL, lids and conjunctivae normal, wears corrective lenses, horizontal nystagmus noted with fast beat to the  right. ENMT: Mucous membranes are moist.  Tympanic membrane is pearly white, no erythema or bulging. Neck: normal, supple, no masses, no thyromegaly Respiratory: clear to auscultation bilaterally, no wheezing, no crackles. Normal respiratory effort. No accessory muscle use.  Cardiovascular: Regular rate and rhythm, no murmurs / rubs / gallops. No extremity edema. 2+ pedal pulses. No carotid bruits.  Abdomen: no tenderness, no masses palpated. No hepatosplenomegaly. Bowel sounds positive.  Musculoskeletal: no clubbing / cyanosis. No joint deformity upper and lower extremities. Good ROM, no contractures. Normal muscle tone.  Skin: no rashes, lesions, ulcers. No induration Neurologic:grossly intact and nonfocal. Psychiatric: Normal judgment and insight. Alert and oriented x 3. Normal mood.    Impression and Plan:  Benign paroxysmal positional vertigo of right ear -Positive right sided Marye Round with successful Epley maneuver with resolution of  symptoms.  Depression, major, single episode, severe ([place])  - Plan: DULoxetine (CYMBALTA) 30 MG capsule  Check SARS-CoV-2 Antibodies per patient request : NO COVID SYMPTOMS at present.    Patient Instructions  -Nice seeing you today!!   Benign Positional Vertigo Vertigo is the feeling that you or your surroundings are moving when they are not. Benign positional vertigo is the most common form of vertigo. This is usually a harmless condition (benign). This condition is positional. This means that symptoms are triggered by certain movements and positions. This condition can be dangerous if it occurs while you are doing something that could cause harm to you or others. This includes activities such as driving or operating machinery. What are the causes? In many cases, the cause of this condition is not known. It may be caused by a disturbance in an area of the inner ear that helps your brain to sense movement and balance. This disturbance can be  caused by:  Viral infection (labyrinthitis).  Head injury.  Repetitive motion, such as jumping, dancing, or running. What increases the risk? You are more likely to develop this condition if:  You are a woman.  You are 36 years of age or older. What are the signs or symptoms? Symptoms of this condition usually happen when you move your head or your eyes in different directions. Symptoms may start suddenly, and usually last for less than a minute. They include:  Loss of balance and falling.  Feeling like you are spinning or moving.  Feeling like your surroundings are spinning or moving.  Nausea and vomiting.  Blurred vision.  Dizziness.  Involuntary eye movement (nystagmus). Symptoms can be mild and cause only minor problems, or they can be severe and interfere with daily life. Episodes of benign positional vertigo may return (recur) over time. Symptoms may improve over time. How is this diagnosed? This condition may be diagnosed based on:  Your medical history.  Physical exam of the head, neck, and ears.  Tests, such as: ? MRI. ? CT scan. ? Eye movement tests. Your health care provider may ask you to change positions quickly while he or she watches you for symptoms of benign positional vertigo, such as nystagmus. Eye movement may be tested with a variety of exams that are designed to evaluate or stimulate vertigo. ? An electroencephalogram (EEG). This records electrical activity in your brain. ? Hearing tests. You may be referred to a health care provider who specializes in ear, nose, and throat (ENT) problems (otolaryngologist) or a provider who specializes in disorders of the nervous system (neurologist). How is this treated?  This condition may be treated in a session in which your health care provider moves your head in specific positions to adjust your inner ear back to normal. Treatment for this condition may take several sessions. Surgery may be needed in severe  cases, but this is rare. In some cases, benign positional vertigo may resolve on its own in 2-4 weeks. Follow these instructions at home: Safety  Move slowly. Avoid sudden body or head movements or certain positions, as told by your health care provider.  Avoid driving until your health care provider says it is safe for you to do so.  Avoid operating heavy machinery until your health care provider says it is safe for you to do so.  Avoid doing any tasks that would be dangerous to you or others if vertigo occurs.  If you have trouble walking or keeping your balance, try using a  cane for stability. If you feel dizzy or unstable, sit down right away.  Return to your normal activities as told by your health care provider. Ask your health care provider what activities are safe for you. General instructions  Take over-the-counter and prescription medicines only as told by your health care provider.  Drink enough fluid to keep your urine pale yellow.  Keep all follow-up visits as told by your health care provider. This is important. Contact a health care provider if:  You have a fever.  Your condition gets worse or you develop new symptoms.  Your family or friends notice any behavioral changes.  You have nausea or vomiting that gets worse.  You have numbness or a "pins and needles" sensation. Get help right away if you:  Have difficulty speaking or moving.  Are always dizzy.  Faint.  Develop severe headaches.  Have weakness in your legs or arms.  Have changes in your hearing or vision.  Develop a stiff neck.  Develop sensitivity to light. Summary  Vertigo is the feeling that you or your surroundings are moving when they are not. Benign positional vertigo is the most common form of vertigo.  The cause of this condition is not known. It may be caused by a disturbance in an area of the inner ear that helps your brain to sense movement and balance.  Symptoms include  loss of balance and falling, feeling that you or your surroundings are moving, nausea and vomiting, and blurred vision.  This condition can be diagnosed based on symptoms, physical exam, and other tests, such as MRI, CT scan, eye movement tests, and hearing tests.  Follow safety instructions as told by your health care provider. You will also be told when to contact your health care provider in case of problems. This information is not intended to replace advice given to you by your health care provider. Make sure you discuss any questions you have with your health care provider.  Document Revised: 09/04/2017 Document Reviewed: 09/04/2017 Elsevier Patient Education  [place].  How to Perform the Epley Maneuver The Epley maneuver is an exercise that relieves symptoms of vertigo. Vertigo is the feeling that you or your surroundings are moving when they are not. When you feel vertigo, you may feel like the room is spinning and have trouble walking. Dizziness is a little different than vertigo. When you are dizzy, you may feel unsteady or light-headed. You can do this maneuver at home whenever you have symptoms of vertigo. You can do it up to 3 times a day until your symptoms go away. Even though the Epley maneuver may relieve your vertigo for a few weeks, it is possible that your symptoms will return. This maneuver relieves vertigo, but it does not relieve dizziness. What are the risks? If it is done correctly, the Epley maneuver is considered safe. Sometimes it can lead to dizziness or nausea that goes away after a short time. If you develop other symptoms, such as changes in vision, weakness, or numbness, stop doing the maneuver and call your health care provider. How to perform the Epley maneuver 1. Sit on the edge of a bed or table with your back straight and your legs extended or hanging over the edge of the bed or table. 2. Turn your head halfway toward the affected ear or  side. 3. Lie backward quickly with your head turned until you are lying flat on your back. You may want to position a pillow under  your shoulders. 4. Hold this position for 30 seconds. You may experience an attack of vertigo. This is normal. 5. Turn your head to the opposite direction until your unaffected ear is facing the floor. 6. Hold this position for 30 seconds. You may experience an attack of vertigo. This is normal. Hold this position until the vertigo stops. 7. Turn your whole body to the same side as your head. Hold for another 30 seconds. 8. Sit back up. You can repeat this exercise up to 3 times a day. Follow these instructions at home:  After doing the Epley maneuver, you can return to your normal activities.  Ask your health care provider if there is anything you should do at home to prevent vertigo. He or she may recommend that you: ? Keep your head raised (elevated) with two or more pillows while you sleep. ? Do not sleep on the side of your affected ear. ? Get up slowly from bed. ? Avoid sudden movements during the day. ? Avoid extreme head movement, like looking up or bending over. Contact a health care provider if:  Your vertigo gets worse.  You have other symptoms, including: ? Nausea. ? Vomiting. ? Headache. Get help right away if:  You have vision changes.  You have a severe or worsening headache or neck pain.  You cannot stop vomiting.  You have new numbness or weakness in any part of your body. Summary  Vertigo is the feeling that you or your surroundings are moving when they are not.  The Epley maneuver is an exercise that relieves symptoms of vertigo.  If the Epley maneuver is done correctly, it is considered safe. You can do it up to 3 times a day. This information is not intended to replace advice given to you by your health care provider. Make sure you discuss any questions you have with your health care provider. Document Revised: 03/08/2017  Document Reviewed: 02/14/2016 Elsevier Patient Education  2020 [place], MD [facility]

## 2019-04-16 LAB — SARS-COV-2 ANTIBODIES: SARS-CoV-2 Antibodies: NEGATIVE

## 2019-05-05 ENCOUNTER — Other Ambulatory Visit: Payer: Self-pay | Admitting: Internal Medicine

## 2019-05-05 ENCOUNTER — Encounter: Payer: Self-pay | Admitting: Internal Medicine

## 2019-05-05 DIAGNOSIS — G2581 Restless legs syndrome: Secondary | ICD-10-CM

## 2019-05-06 MED ORDER — ROPINIROLE HCL 4 MG PO TABS: 8.0000 mg | ORAL_TABLET | Freq: Every day | ORAL | 0 refills | Status: AC

## 2019-06-01 ENCOUNTER — Encounter: Payer: Self-pay | Admitting: Internal Medicine

## 2019-11-20 ENCOUNTER — Encounter: Payer: BLUE CROSS/BLUE SHIELD | Admitting: Internal Medicine

## 2021-10-04 IMAGING — CT CT ANGIO CHEST
2 of 6 series · 18 of 36 positions shown · IV contrast (omnipaque)
Comparison: 03/26/2006, 02/15/2019

CLINICAL DATA: Shortness of breath with fever and cough

EXAM:
CT ANGIOGRAPHY CHEST WITH CONTRAST
TECHNIQUE: Multidetector CT imaging of the chest was performed using the
standard protocol during bolus administration of intravenous
contrast. Multiplanar CT image reconstructions and MIPs were
obtained to evaluate the vascular anatomy.
CONTRAST:  100mL OMNIPAQUE IOHEXOL 350 MG/ML SOLN

[Series 5: thins · axial · 0.77mm/px · z∈[-294,-49]mm · 17 of 273 slices shown]
[im 14/273  lung]
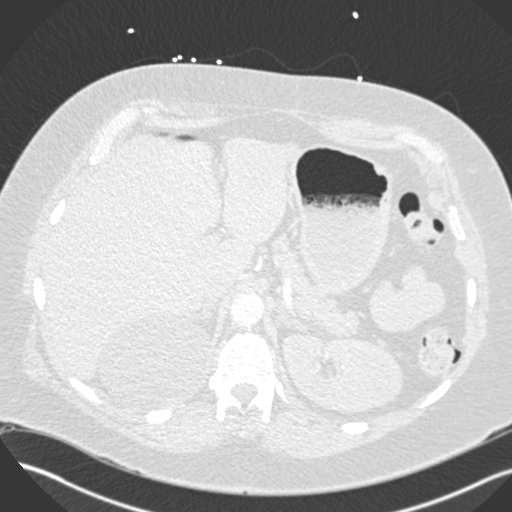
[im 28/273  mediastinal]
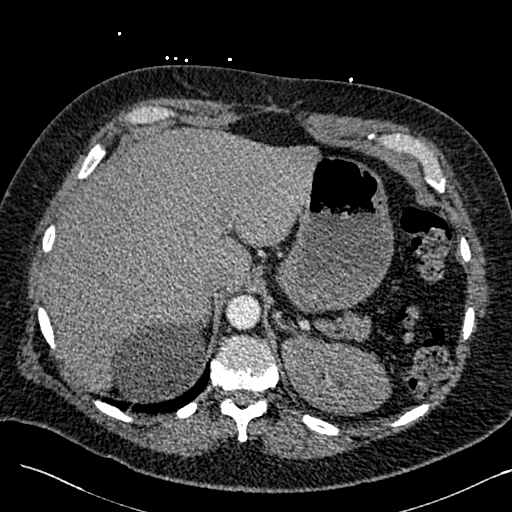
[im 41/273  lung]
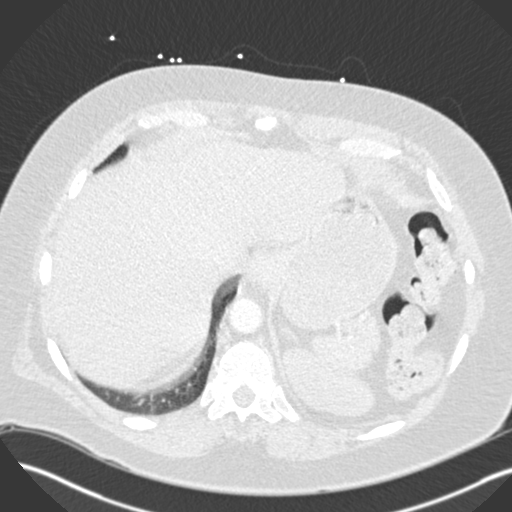
[im 55/273  mediastinal]
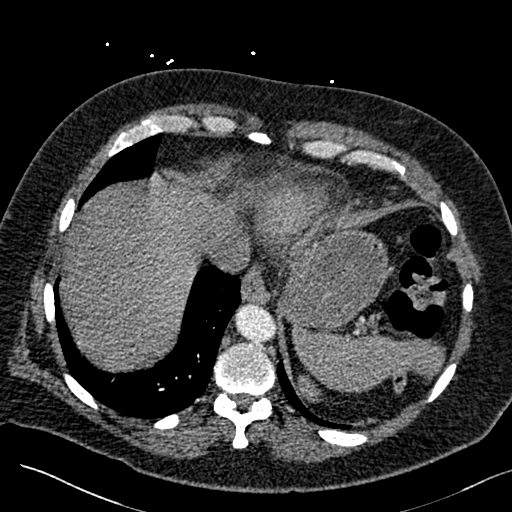
[im 82/273  lung]
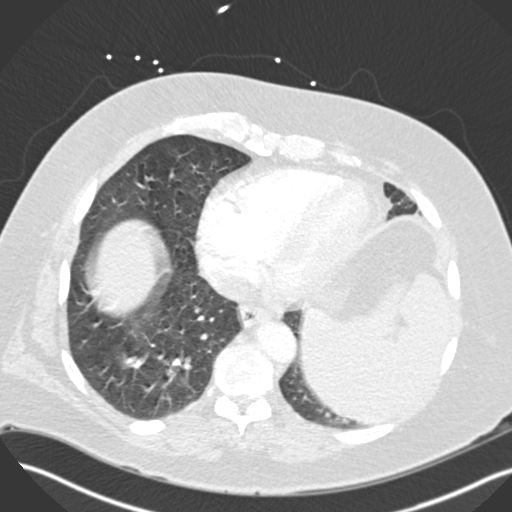
[im 96/273  mediastinal]
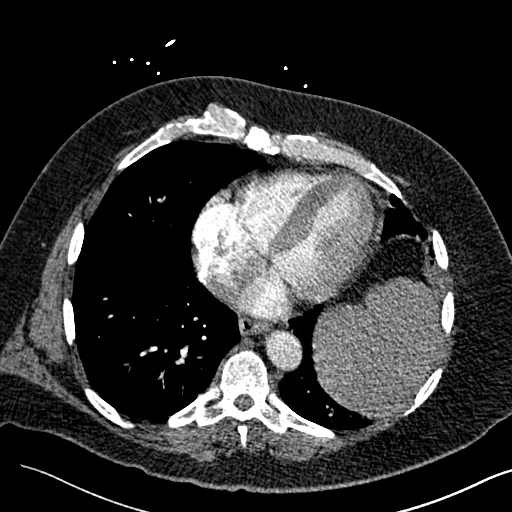
[im 109/273  lung]
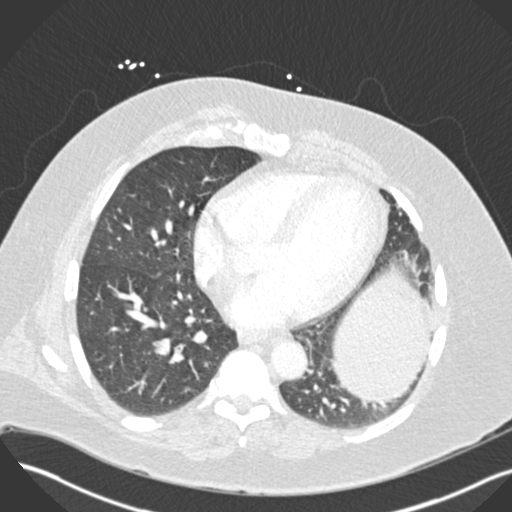
[im 123/273  mediastinal]
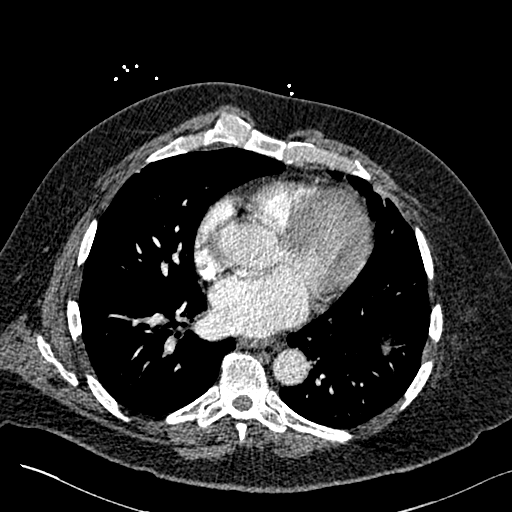
[im 137/273  lung]
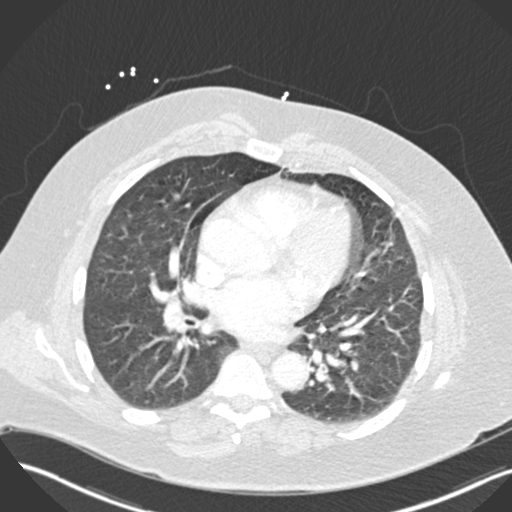
[im 150/273  mediastinal]
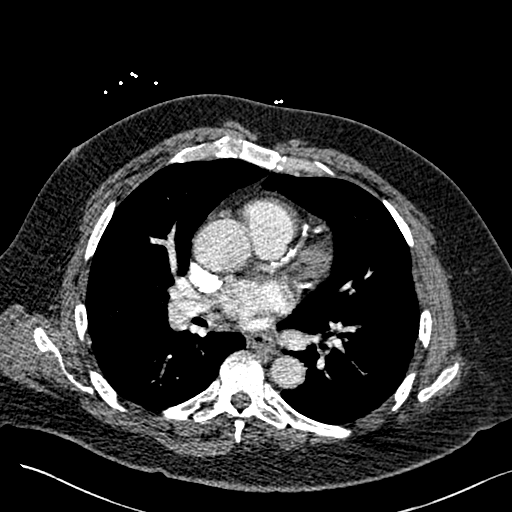
[im 164/273  lung]
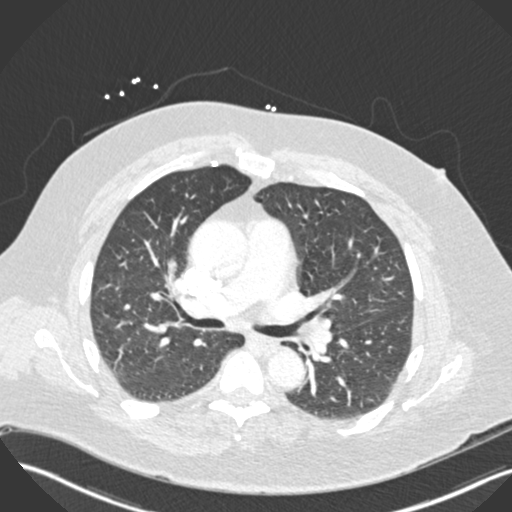
[im 177/273  mediastinal]
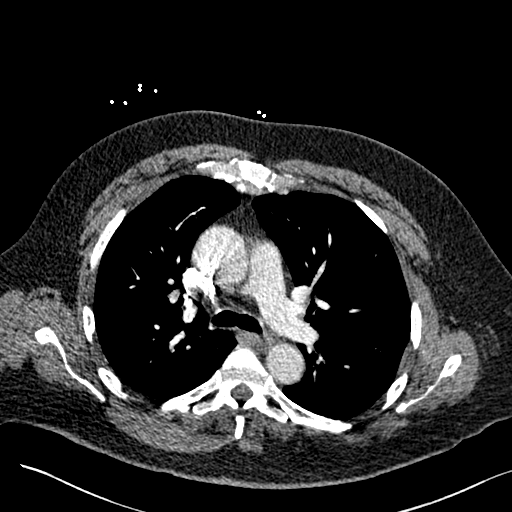
[im 191/273  lung]
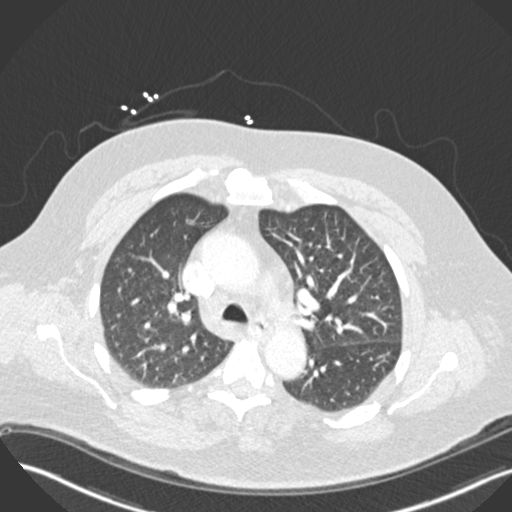
[im 218/273  mediastinal]
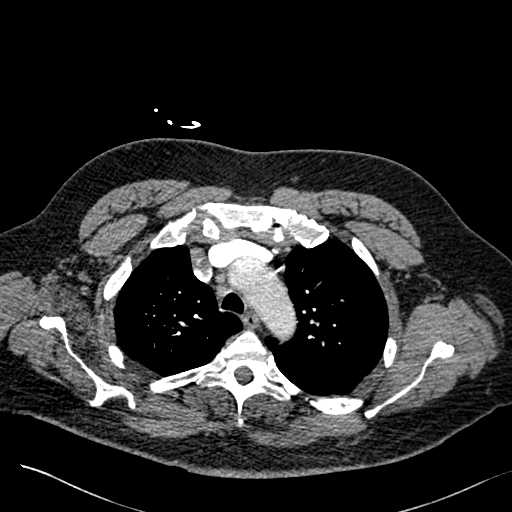
[im 232/273  lung]
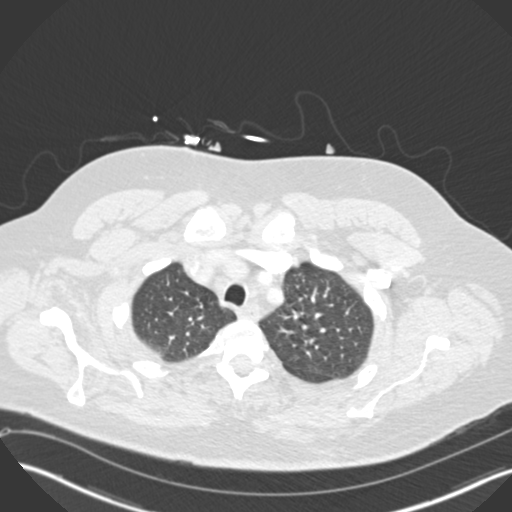
[im 245/273  mediastinal]
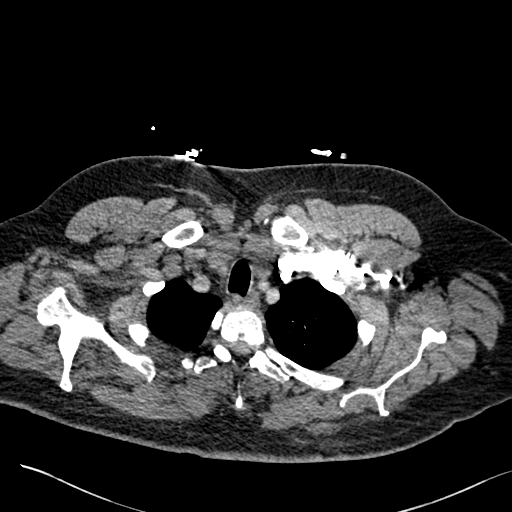
[im 259/273  lung]
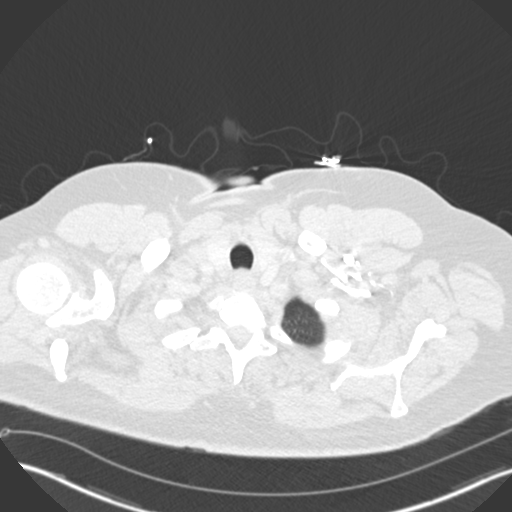

[Series 6: coronal mpr · coronal · 0.55mm/px · 1 of 165 slices shown]
[im 83/165  mediastinal]
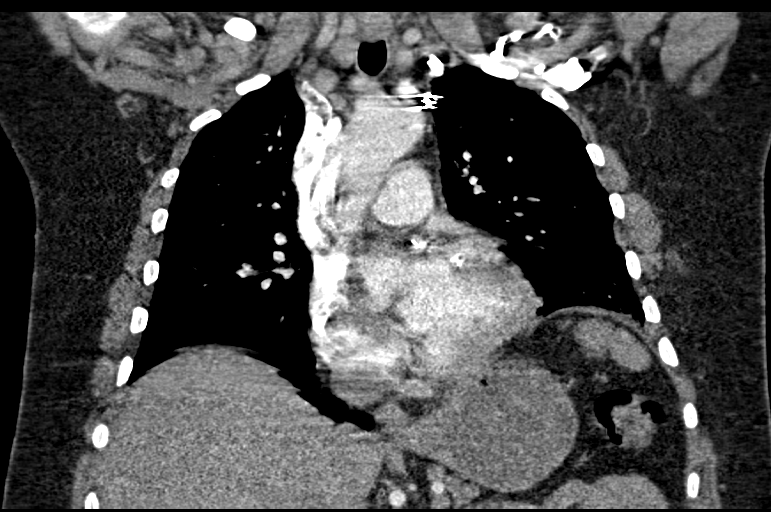

[18 of 36 positions shown; findings below may reference images not displayed]

FINDINGS: Cardiovascular: Ascending aorta demonstrates mild aneurysmal
dilatation to 4.3 cm. Normal tapering in the thoracic aortic arch is
seen. The descending thoracic aorta is within limits. No dissection
seen. Heart is not significantly enlarged. Coronary calcifications
are noted. Pulmonary artery is normal limits. No focal filling
defects are identified to suggest pulmonary embolism.

Mediastinum/Nodes: Esophagus as visualized is within normal limits.
No hilar or mediastinal adenopathy is noted. Thoracic inlet is
within normal limits.

Lungs/Pleura: Lungs are well aerated bilaterally. No focal
infiltrate or sizable effusion is seen.

Upper Abdomen: Visualized upper abdomen demonstrates cystic change
of the right kidney stable from prior CT from 0997.

Musculoskeletal: Degenerative changes of the thoracic spine are
noted.

Review of the MIP images confirms the above findings.
IMPRESSION: No evidence of pulmonary emboli.

Dilatation of the ascending aorta as described. Recommend annual
imaging followup by CTA or MRA. This recommendation follows 6949
ACCF/AHA/AATS/ACR/ASA/SCA/MOOLMAN/LKHADIR/WILLIAM ALFONSO/SITI NUR Guidelines for the
Diagnosis and Management of Patients with Thoracic Aortic Disease.
Circulation. 6949; 121: [id number]. Aortic aneurysm NOS ([id number].E)

No other focal abnormality is noted.
